# Patient Record
Sex: Female | Born: 1995
Health system: Southern US, Community
[De-identification: ages and names within clinical notes are randomized; demographics above are authoritative.]

## PROBLEM LIST (undated history)

## (undated) ENCOUNTER — Emergency Department (HOSPITAL_COMMUNITY): Discharge status: Absent without leave | Payer: Self-pay

## (undated) VITALS — BP 105/73 | HR 74 | Temp 97.9°F | Resp 16 | Ht 62.21 in | Wt 129.4 lb

## (undated) DIAGNOSIS — J302 Other seasonal allergic rhinitis: Secondary | ICD-10-CM

## (undated) DIAGNOSIS — F329 Major depressive disorder, single episode, unspecified: Secondary | ICD-10-CM

## (undated) DIAGNOSIS — F419 Anxiety disorder, unspecified: Secondary | ICD-10-CM

## (undated) DIAGNOSIS — F32A Depression, unspecified: Secondary | ICD-10-CM

## (undated) HISTORY — PX: TOOTH EXTRACTION: SHX859

## (undated) HISTORY — DX: Other seasonal allergic rhinitis: J30.2

---
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
## 2014-01-03 ENCOUNTER — Encounter (HOSPITAL_COMMUNITY): Payer: Self-pay | Admitting: Emergency Medicine

## 2014-01-03 DIAGNOSIS — Z7951 Long term (current) use of inhaled steroids: Secondary | ICD-10-CM | POA: Insufficient documentation

## 2014-01-03 DIAGNOSIS — Y9341 Activity, dancing: Secondary | ICD-10-CM | POA: Insufficient documentation

## 2014-01-03 DIAGNOSIS — Z3202 Encounter for pregnancy test, result negative: Secondary | ICD-10-CM | POA: Insufficient documentation

## 2014-01-03 DIAGNOSIS — J45909 Unspecified asthma, uncomplicated: Secondary | ICD-10-CM | POA: Insufficient documentation

## 2014-01-03 DIAGNOSIS — Z8659 Personal history of other mental and behavioral disorders: Secondary | ICD-10-CM | POA: Insufficient documentation

## 2014-01-03 DIAGNOSIS — Z880 Allergy status to penicillin: Secondary | ICD-10-CM | POA: Insufficient documentation

## 2014-01-03 DIAGNOSIS — Y9289 Other specified places as the place of occurrence of the external cause: Secondary | ICD-10-CM | POA: Insufficient documentation

## 2014-01-03 DIAGNOSIS — X58XXXA Exposure to other specified factors, initial encounter: Secondary | ICD-10-CM | POA: Insufficient documentation

## 2014-01-03 DIAGNOSIS — Z79899 Other long term (current) drug therapy: Secondary | ICD-10-CM | POA: Insufficient documentation

## 2014-01-03 DIAGNOSIS — S8392XA Sprain of unspecified site of left knee, initial encounter: Secondary | ICD-10-CM | POA: Insufficient documentation

## 2014-01-03 NOTE — ED Notes (Signed)
Pt reports she was at dance practice when she injured her left knee - states "she feels like her knee is dislocated." CMS distally to injury intact.

## 2014-01-04 ENCOUNTER — Emergency Department (HOSPITAL_COMMUNITY): Payer: Self-pay

## 2014-01-04 ENCOUNTER — Emergency Department (HOSPITAL_COMMUNITY)
Admission: EM | Admit: 2014-01-04 | Discharge: 2014-01-04 | Disposition: A | Discharge status: Home / Self Care | Payer: Self-pay | Attending: Emergency Medicine | Admitting: Emergency Medicine

## 2014-01-04 DIAGNOSIS — S8392XA Sprain of unspecified site of left knee, initial encounter: Secondary | ICD-10-CM

## 2014-01-04 LAB — POC URINE PREG, ED: Preg Test, Ur: NEGATIVE

## 2014-01-04 MED ORDER — HYDROCODONE-ACETAMINOPHEN 5-325 MG PO TABS
2.0000 | ORAL_TABLET | Freq: Once | ORAL | Status: AC
  Administered 2014-01-04: 2 via ORAL
  Filled 2014-01-04: qty 2

## 2014-01-04 MED ORDER — IBUPROFEN 800 MG PO TABS: 800.0000 mg | ORAL_TABLET | Freq: Three times a day (TID) | ORAL | Status: DC

## 2014-01-04 MED ORDER — HYDROCODONE-ACETAMINOPHEN 5-325 MG PO TABS: 2.0000 | ORAL_TABLET | ORAL | Status: DC | PRN

## 2014-01-04 NOTE — Discharge Instructions (Signed)

## 2014-01-04 NOTE — ED Provider Notes (Signed)
Medical screening examination/treatment/procedure(s) were performed by non-physician practitioner and as supervising physician I was immediately available for consultation/collaboration.   EKG Interpretation None        Tanna Furry, MD 01/04/14 2315

## 2014-01-04 NOTE — ED Provider Notes (Signed)
CSN: [id number]     Arrival date & time 01/03/14  2351 History   First MD Initiated Contact with Patient 01/04/14 0003     Chief Complaint  Patient presents with  . Knee Pain     (Consider location/radiation/quality/duration/timing/severity/associated sxs/prior Treatment) Patient is a 18 y.o. female presenting with knee pain. The history is provided by the patient. No language interpreter was used.  Knee Pain Location:  Knee Time since incident:  1 hour Injury: yes   Mechanism of injury comment:  Twist Knee location:  L knee Pain details:    Quality:  Aching   Radiates to:  Does not radiate   Severity:  Moderate   Onset quality:  Gradual   Duration:  1 hour   Timing:  Constant   Progression:  Worsening Chronicity:  New Dislocation: no   Prior injury to area:  Yes Relieved by:  Nothing Worsened by:  Nothing tried Ineffective treatments:  None tried Risk factors: no concern for non-accidental trauma   Pt was dancing and knee popped.  Pt thought she dislocated her knee  Past Medical History  Diagnosis Date  . Asthma   . Depression   . Anxiety    Past Surgical History  Procedure Laterality Date  . Tooth extraction     History reviewed. No pertinent family history. History  Substance Use Topics  . Smoking status: Never Smoker   . Smokeless tobacco: Never Used  . Alcohol Use: No   OB History   Grav Para Term Preterm Abortions TAB SAB Ect Mult Living                 Review of Systems  Musculoskeletal: Positive for joint swelling and myalgias.  All other systems reviewed and are negative.     Allergies  Penicillins  Home Medications   Prior to Admission medications   Medication Sig Start Date End Date Taking? Authorizing Provider  albuterol (PROVENTIL HFA;VENTOLIN HFA) 108 (90 BASE) MCG/ACT inhaler Inhale 2 puffs into the lungs every 6 (six) hours as needed. For shortness of breath 07/27/13   Janelle Floor, MD  cetirizine (ZYRTEC) 10 MG tablet  TAKE 1 TABLET BY MOUTH ONCE DAILY 07/01/13   Ok Edwards, MD  erythromycin ophthalmic ointment Place a 1/2 inch ribbon of ointment into the lower eyelid every 6 hours for 5 days 12/07/13   Gregor Hams, MD  fluticasone (FLONASE) 50 MCG/ACT nasal spray Place 2 sprays into the nose daily. 09/19/12   Shruti Anderson [place], MD  Fluticasone-Salmeterol (ADVAIR) 100-50 MCG/DOSE AEPB Inhale 1 puff into the lungs every 12 (twelve) hours. Patient may resume home supply. 09/19/12   Shruti Anderson [place], MD  ibuprofen (ADVIL,MOTRIN) 800 MG tablet Take 1 tablet (800 mg total) by mouth once. Take 3-4 hours prior to IUD insertion, repeat after procedure q 6 hrs prn 04/18/13   Andree Coss, MD  montelukast (SINGULAIR) 10 MG tablet TAKE 1 TABLET BY MOUTH ONCE DAILY    Ok Edwards, MD   LMP 01/01/2014 Physical Exam  Nursing note and vitals reviewed. Constitutional: She is oriented to person, place, and time. She appears well-developed and well-nourished.  HENT:  Head: Normocephalic.  Musculoskeletal: She exhibits tenderness.  Swollen tender left knee,  Pt unable to tolerate range of motion testing.  nv and ns intact,  No gross instability  Neurological: She is alert and oriented to person, place, and time. She has normal reflexes.  Skin: Skin is warm.  Psychiatric: She has  a normal mood and affect.    ED Course  Procedures (including critical care time) Labs Review Labs Reviewed  POC URINE PREG, ED    Imaging Review No results found.   EKG Interpretation None      MDM   Final diagnoses:  Knee sprain, left, initial encounter    Knee imbolixer crutches    Fransico Meadow, PA-C 01/04/14 [phone]

## 2014-01-04 NOTE — Progress Notes (Signed)
Orthopedic Tech Progress Note Patient Details:  Stacey Moyer 1995-07-20 [id number]  Ortho Devices Type of Ortho Device: Crutches;Knee Immobilizer Ortho Device/Splint Interventions: Application   Katheren Shams 01/04/2014, 12:57 AM

## 2014-01-09 ENCOUNTER — Ambulatory Visit: Payer: Medicaid Other

## 2014-01-12 ENCOUNTER — Encounter (INDEPENDENT_AMBULATORY_CARE_PROVIDER_SITE_OTHER): Payer: Self-pay

## 2014-01-12 ENCOUNTER — Ambulatory Visit (INDEPENDENT_AMBULATORY_CARE_PROVIDER_SITE_OTHER): Payer: Self-pay | Admitting: Family Medicine

## 2014-01-12 ENCOUNTER — Encounter: Payer: Self-pay | Admitting: Family Medicine

## 2014-01-12 VITALS — BP 114/64 | Ht 63.0 in | Wt 135.0 lb

## 2014-01-12 DIAGNOSIS — M25562 Pain in left knee: Secondary | ICD-10-CM

## 2014-01-12 DIAGNOSIS — S83002A Unspecified subluxation of left patella, initial encounter: Secondary | ICD-10-CM

## 2014-01-12 NOTE — Patient Instructions (Signed)
You had a patellar subluxation I want your to ICE twice a day for 15-20 minutes each time Do  Your exercises at least 4 times a day Heel slides--25 repetitions Quadricep contractions--25 reps Walk with crutches and 'touch down only" weight bearing Ok use wheel chair See me back in 2-3 weeks

## 2014-01-15 DIAGNOSIS — S83003A Unspecified subluxation of unspecified patella, initial encounter: Secondary | ICD-10-CM | POA: Insufficient documentation

## 2014-01-15 NOTE — Assessment & Plan Note (Signed)
I will keep her nonweightbearing which includes wheelchair at school and crutches the rest of the time. I will take her out of the knee immobilizer and begin range of motion exercises including heel slides and quadricep isometric contraction multiple times a day. Please see patient instructions. Icing. Long discussion about this knee injury which is most likely patellar subluxation rather than a simple knee sprain.it may take her to to 6 more weeks to get back to 90% function. I'll see her back in 2 weeks. I have given her notes for her school in her work.

## 2014-01-15 NOTE — Progress Notes (Signed)
Patient ID: Stacey Moyer, female   DOB: [birth year], 18 y.o.   MRN: [id number]  Stacey Moyer - 18 y.o. female MRN [id number]  Date of birth: 1995/05/27    SUBJECTIVE:     Left knee pain. Date of injury 01/04/2014. Patient was dancing in street shoes, that is not her regular daily shoes, when she lost her footing and slipped forward with acute knee pain. She heard a pop. Had fairly immediate swelling. Was seen at the emergency room where they placed her in the immobilizer and did x-rays. She is warned that knee immobilizer since then has continued to have swelling and stiffness. She is walking with crutches. ROS:     Positive for knee swelling but no knee erythema. Knee stiffness is present. No calf pain. No fever, sweats, chills.  PERTINENT  PMH / PSH FH / / SH:  Past Medical, Surgical, Social, and Family History Reviewed & Updated in the EMR.  Pertinent findings include:  Works part time and is going to school full-time. She has rendered a wheelchair to help her get around And says using crutches for everyday activities. Nonsmoker. Past medical history significant for asthma, no prior history of knee injury or knee surgeries.  OBJECTIVE: BP 114/64 mmHg  Ht 5\' 3"  (1.6 m)  Wt 135 lb (61.236 kg)  BMI 23.92 kg/m2  LMP 01/01/2014  Physical Exam:  Vital signs are reviewed. Well-developed female no acute distress KNEES: Left knee has small to moderate effusion. She has difficulty with full extension, she lacks full extension by 20. She has full flexion but pain with the last 15-20 of flexion. The knee Is in place. Tender to palpation along the medial and lateral border of the knee. Quad tendon and the patellar tendon are without defect. Calf is soft. Distally she is neurovascularly intact. IMAGING: Reviewed her for view knee x-rays from October 22 which showed no evidence of fracture.  ASSESSMENT & PLAN:  See problem based charting & AVS for pt instructions.

## 2014-01-31 ENCOUNTER — Encounter: Payer: Self-pay | Admitting: Pediatrics

## 2014-01-31 ENCOUNTER — Ambulatory Visit (INDEPENDENT_AMBULATORY_CARE_PROVIDER_SITE_OTHER): Payer: Self-pay | Admitting: Pediatrics

## 2014-01-31 VITALS — Wt 143.0 lb

## 2014-01-31 DIAGNOSIS — N938 Other specified abnormal uterine and vaginal bleeding: Secondary | ICD-10-CM

## 2014-01-31 LAB — CBC WITH DIFFERENTIAL/PLATELET
Basophils Absolute: 0 K/uL (ref 0.0–0.1)
Basophils Relative: 0 % (ref 0–1)
Eosinophils Absolute: 0 K/uL (ref 0.0–0.7)
Eosinophils Relative: 1 % (ref 0–5)
HCT: 38.5 % (ref 36.0–46.0)
Hemoglobin: 12.5 g/dL (ref 12.0–15.0)
Lymphocytes Relative: 50 % — ABNORMAL HIGH (ref 12–46)
Lymphs Abs: 2.3 K/uL (ref 0.7–4.0)
MCH: 28.5 pg (ref 26.0–34.0)
MCHC: 32.5 g/dL (ref 30.0–36.0)
MCV: 87.9 fL (ref 78.0–100.0)
MPV: 10.7 fL (ref 9.4–12.4)
Monocytes Absolute: 0.4 K/uL (ref 0.1–1.0)
Monocytes Relative: 8 % (ref 3–12)
Neutro Abs: 1.8 K/uL (ref 1.7–7.7)
Neutrophils Relative %: 41 % — ABNORMAL LOW (ref 43–77)
Platelets: 276 K/uL (ref 150–400)
RBC: 4.38 MIL/uL (ref 3.87–5.11)
RDW: 13.6 % (ref 11.5–15.5)
WBC: 4.5 K/uL (ref 4.0–10.5)

## 2014-01-31 MED ORDER — IBUPROFEN 600 MG PO TABS: 600.0000 mg | ORAL_TABLET | Freq: Three times a day (TID) | ORAL | Status: DC

## 2014-01-31 NOTE — Progress Notes (Signed)
History was provided by the patient.  Stacey Moyer is a 18 y.o. female who is here for irregular menstrual bleeding.    HPI:  Nexplanon Implant placed 07/18/13 - [place] reports that prior to implant, she had normal periods. (Placed strictly for contraception). This was her first form of contraception. Periods continued normally for several months, just a little more frequent than usual. Then, last month, had period 01/05/14 x 5 days, and again 01/19/14 x 3 days, brown blood then light blood then brown, then light blood. Still currently bleeding in this alternating manner (needs to wear a pantiliner all the time).  + abdominal cramping, prior to October, used to occur on first and last day of periods. Now, for the past 2 months, cramps are occuring through entire bleeding periods. Uses Advil 400mg  PRN for this. (She hasn't used much lately, because she took a lot of pain medicine recently for a knee sprain, so was trying to avoid taking too much, and hydrocodone pain medicine had made her sick to her stomach, so she was trying to avoid 'pain meds').  + sexually active, one partner x 2 years. Not using condoms. Last STD screen May 2015. Consents to GC/CT, HIV and RPR testing today. Denies any abnormal vaginal discharge other than blood. No dyspareunia.  No dysuria  Also c/o frequent headaches for several days to weeks. Doesn't drink much fluid in general.  Patient Active Problem List   Diagnosis Date Noted  . Patellar subluxation 01/15/2014  . Presence of subdermal contraceptive implant 02/15/2013  . Allergic rhinitis 09/19/2012  . Mild persistent asthma 09/19/2012   Current Outpatient Prescriptions on File Prior to Visit  Medication Sig Dispense Refill  . albuterol (PROVENTIL HFA;VENTOLIN HFA) 108 (90 BASE) MCG/ACT inhaler Inhale 2 puffs into the lungs every 6 (six) hours as needed. For shortness of breath 1 Inhaler 1  . cetirizine (ZYRTEC) 10 MG tablet TAKE 1 TABLET BY MOUTH ONCE DAILY 31  tablet PRN  . erythromycin ophthalmic ointment Place a 1/2 inch ribbon of ointment into the lower eyelid every 6 hours for 5 days 1 g 1  . fluticasone (FLONASE) 50 MCG/ACT nasal spray Place 2 sprays into the nose daily. 16 g 12  . Fluticasone-Salmeterol (ADVAIR) 100-50 MCG/DOSE AEPB Inhale 1 puff into the lungs every 12 (twelve) hours. Patient may resume home supply. 60 each 3  . HYDROcodone-acetaminophen (NORCO/VICODIN) 5-325 MG per tablet Take 2 tablets by mouth every 4 (four) hours as needed. 16 tablet 0  . ibuprofen (ADVIL,MOTRIN) 800 MG tablet Take 1 tablet (800 mg total) by mouth once. Take 3-4 hours prior to IUD insertion, repeat after procedure q 6 hrs prn 10 tablet 0  . ibuprofen (ADVIL,MOTRIN) 800 MG tablet Take 1 tablet (800 mg total) by mouth 3 (three) times daily. 21 tablet 0  . montelukast (SINGULAIR) 10 MG tablet TAKE 1 TABLET BY MOUTH ONCE DAILY 31 tablet PRN   No current facility-administered medications on file prior to visit.   The following portions of the patient's history were reviewed and updated as appropriate: allergies, current medications, past family history, past medical history, past social history, past surgical history and problem list.  Physical Exam:    Filed Vitals:   01/31/14 1406  Weight: 143 lb (64.864 kg)   Patient's last menstrual period was 01/01/2014. per CMA - this was later changed to above, per HPI.  General:   alert, cooperative and no distress  Gait:   normal  Skin:   normal  Oral cavity:   lips, mucosa, and tongue normal; teeth and gums normal  Eyes:   sclerae white, pupils equal and reactive     Neck:   no adenopathy, supple, symmetrical, trachea midline and thyroid not enlarged, symmetric, no tenderness/mass/nodules  Lungs:  clear to auscultation bilaterally  Heart:   regular rate and rhythm, S1, S2 normal, no murmur, click, rub or gallop  Abdomen:  soft, normal bowel sounds; mild vague tenderness to palpation throughout all quadrants   GU:  not examined  Extremities:   extremities normal, atraumatic, no cyanosis or edema  Neuro:  normal without focal findings and mental status, speech normal, alert and oriented x3    Assessment/Plan:  1. Dysfunctional uterine bleeding - patient is aware that this may be unwanted side effect of nexplanon - GC/chlamydia probe amp, urine - HIV antibody - RPR - CBC with Differential - ibuprofen (ADVIL,MOTRIN) 600 MG tablet; Take 1 tablet (600 mg total) by mouth 3 (three) times daily. For 7 days.  Dispense: 21 tablet; Refill: 1 - Flu vaccine nasal quad. - counseled regarding vaccine - consider oral hormonal therapy if no improvement - counseled re: likely cause, workup, other considerations, such as anemia, drink more fluids, document headaches, etc. Time spent: 25 minutes, with >50% counseling. - Follow-up visit in 2 weeks for recheck (of note, patient already has a follow up appointment with Dr. Henrene Pastor scheduled in 2 weeks), or sooner as needed.

## 2014-01-31 NOTE — Patient Instructions (Addendum)
Dysfunctional Uterine Bleeding Normally, menstrual periods begin between ages 91 to 9 in young women. A normal menstrual cycle/period may begin every 23 days up to 35 days and lasts from 1 to 7 days. Around 12 to 14 days before your menstrual period starts, ovulation (ovary produces an egg) occurs. When counting the time between menstrual periods, count from the first day of bleeding of the previous period to the first day of bleeding of the next period. Dysfunctional (abnormal) uterine bleeding is bleeding that is different from a normal menstrual period. Your periods may come earlier or later than usual. They may be lighter, have blood clots or be heavier. You may have bleeding between periods, or you may skip one period or more. You may have bleeding after sexual intercourse, bleeding after menopause, or no menstrual period. CAUSES   Pregnancy (normal, miscarriage, tubal).  IUDs (intrauterine device, birth control).  Birth control pills.  Hormone treatment.  Menopause.  Infection of the cervix.  Blood clotting problems.  Infection of the inside lining of the uterus.  Endometriosis, inside lining of the uterus growing in the pelvis and other female organs.  Adhesions (scar tissue) inside the uterus.  Obesity or severe weight loss.  Uterine polyps inside the uterus.  Cancer of the vagina, cervix, or uterus.  Ovarian cysts or polycystic ovary syndrome.  Medical problems (diabetes, thyroid disease).  Uterine fibroids (noncancerous tumor).  Problems with your female hormones.  Endometrial hyperplasia, very thick lining and enlarged cells inside of the uterus.  Medicines that interfere with ovulation.  Radiation to the pelvis or abdomen.  Chemotherapy. DIAGNOSIS   Your doctor will discuss the history of your menstrual periods, medicines you are taking, changes in your weight, stress in your life, and any medical problems you may have.  Your doctor will do a physical  and pelvic examination.  Your doctor may want to perform certain tests to make a diagnosis, such as:  Pap test.  Blood tests.  Cultures for infection. TREATMENT  Treatment will depend on the cause of the dysfunctional uterine bleeding (DUB). Treatment may include:  Observing your menstrual periods for a couple of months.  Prescribing medicines for medical problems, including:  Antibiotics.  Hormones.  Birth control pills.  NSAIDS (ibuprofen, diclofenac) HOME CARE INSTRUCTIONS   If medicines were prescribed, take exactly as directed. Do not change or switch medicines without consulting your caregiver.  Long term heavy bleeding may result in iron deficiency. Your caregiver may have prescribed iron pills. They help replace the iron that your body lost from heavy bleeding. Take exactly as directed.  Do not take aspirin or medicines that contain aspirin one week before or during your menstrual period. Aspirin may make the bleeding worse.  If you need to change your sanitary pad or tampon more than once every 2 hours, stay in bed with your feet elevated and a cold pack on your lower abdomen. Rest as much as possible, until the bleeding stops or slows down.  Eat well-balanced meals. Eat foods high in iron. Examples are:  Leafy green vegetables.  Whole-grain breads and cereals.  Eggs.  Meat.  Liver.  Do not try to lose weight until the abnormal bleeding has stopped and your blood iron level is back to normal. Do not lift more than ten pounds or do strenuous activities when you are bleeding.  For a couple of months, make note on your calendar, marking the start and ending of your period, and the type of bleeding (light,  medium, heavy, spotting, clots or missed periods). This is for your caregiver to better evaluate your problem. SEEK MEDICAL CARE IF:   You develop nausea (feeling sick to your stomach) and vomiting, dizziness, or diarrhea while you are taking your  medicine.  You are getting lightheaded or weak.  You have any problems that may be related to the medicine you are taking.  You develop pain with your DUB.  You want to remove your IUD.  You want to stop or change your birth control pills or hormones.  You have any type of abnormal bleeding mentioned above.  You are over 76 years old and have not had a menstrual period yet.  You are 18 years old and you are still having menstrual periods.  You have any of the symptoms mentioned above.  You develop a rash. SEEK IMMEDIATE MEDICAL CARE IF:   An oral temperature above 102 F (38.9 C) develops.  You develop chills.  You are changing your sanitary pad or tampon more than once an hour.  You develop abdominal pain.  You pass out or faint. Document Released: 02/28/2000 Document Revised: 05/25/2011 Document Reviewed: 01/29/2009 [facility] Patient Information 2015 [place], [place]. This information is not intended to replace advice given to you by your health care provider. Make sure you discuss any questions you have with your health care provider.

## 2014-02-01 ENCOUNTER — Telehealth: Payer: Self-pay | Admitting: *Deleted

## 2014-02-01 ENCOUNTER — Ambulatory Visit: Payer: Self-pay | Attending: Family Medicine | Admitting: Physical Therapy

## 2014-02-01 DIAGNOSIS — S83003D Unspecified subluxation of unspecified patella, subsequent encounter: Secondary | ICD-10-CM | POA: Insufficient documentation

## 2014-02-01 DIAGNOSIS — J45909 Unspecified asthma, uncomplicated: Secondary | ICD-10-CM | POA: Insufficient documentation

## 2014-02-01 DIAGNOSIS — S8392XA Sprain of unspecified site of left knee, initial encounter: Secondary | ICD-10-CM

## 2014-02-01 DIAGNOSIS — F419 Anxiety disorder, unspecified: Secondary | ICD-10-CM | POA: Insufficient documentation

## 2014-02-01 DIAGNOSIS — Z5189 Encounter for other specified aftercare: Secondary | ICD-10-CM | POA: Insufficient documentation

## 2014-02-01 DIAGNOSIS — F329 Major depressive disorder, single episode, unspecified: Secondary | ICD-10-CM | POA: Insufficient documentation

## 2014-02-01 DIAGNOSIS — S838X2A Sprain of other specified parts of left knee, initial encounter: Secondary | ICD-10-CM | POA: Insufficient documentation

## 2014-02-01 LAB — GC/CHLAMYDIA PROBE AMP, URINE
Chlamydia, Swab/Urine, PCR: NEGATIVE
GC Probe Amp, Urine: NEGATIVE

## 2014-02-01 LAB — HIV ANTIBODY (ROUTINE TESTING W REFLEX): HIV 1&2 Ab, 4th Generation: NONREACTIVE

## 2014-02-01 LAB — RPR

## 2014-02-01 NOTE — Telephone Encounter (Signed)
Pt stated that she was to come x2/ week, gave the pt appts but there was no appt notes...td

## 2014-02-01 NOTE — Patient Instructions (Signed)
The patient is advised to apply ice or cold packs intermittently as needed to relieve pain.  Copyright  VHI. All rights reserved.  Heel Slide   Bend knee and pull heel toward buttocks. Hold _5  seconds. Return. Repeat with other knee. Repeat ___10_ times. Do 2-3____ sessions per day.  http://gt2.exer.us/372   Copyright  VHI. All rights reserved.     Raise leg until knee is straight. ___10 reps per set, _2__ sets per day, __7_ days per week  Copyright  VHI. All rights reserved.  Short Arc [employer] a large can or rolled towel under leg. Straighten knee and leg. Hold ____5 seconds. Repeat with other leg. Repeat __10_ times. Do __2-3__ sessions per day.  http://gt2.exer.us/365   Copyright  VHI. All rights reserved.  Quad Set   Slowly tighten muscles on thigh of straight leg while counting out loud to __5__. Repeat with other leg. Repeat _5___ times. Do __2-3__ sessions per day.  http://gt2.exer.us/361   Copyright  VHI. All rights reserved.

## 2014-02-02 ENCOUNTER — Ambulatory Visit (INDEPENDENT_AMBULATORY_CARE_PROVIDER_SITE_OTHER): Payer: Self-pay | Admitting: Family Medicine

## 2014-02-02 ENCOUNTER — Encounter: Payer: Self-pay | Admitting: Physical Therapy

## 2014-02-02 VITALS — BP 118/76 | HR 71 | Ht 63.0 in | Wt 140.0 lb

## 2014-02-02 DIAGNOSIS — S83002D Unspecified subluxation of left patella, subsequent encounter: Secondary | ICD-10-CM

## 2014-02-02 NOTE — Therapy (Signed)
Physical Therapy Evaluation  Patient Details  Name: Stacey Moyer MRN: [id number] Date of Birth: [birth year]  Encounter Date: 02/01/2014      PT End of Session - 02/01/14 1544    Visit Number 1   Number of Visits 16   Date for PT Re-Evaluation 03/29/14   PT Start Time 1505   PT Stop Time 1550   PT Time Calculation (min) 45 min   Activity Tolerance Patient tolerated treatment well;Patient limited by pain      Past Medical History  Diagnosis Date  . Asthma   . Depression   . Anxiety     Past Surgical History  Procedure Laterality Date  . Tooth extraction      LMP 01/01/2014  Visit Diagnosis:  Knee sprain and strain, left, initial encounter      Subjective Assessment - 02/01/14 1511    Symptoms Patient slipped and fell during dance practice, saw Lt. kneecap pop out to the side, fell on knees.  She went to ED, could not bear weight and is now TTWB.    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 cumulative stiffness throughout the day   How long can you stand comfortably? 10 min Rt. leg tired   How long can you walk comfortably? with crutches, as needed.    Diagnostic tests XR   Patient Stated Goals cannot stand to cook, dance, return to work   Currently in Pain? Other (Comment)   Pain Score --  Pain increases in AM med/lat. , 5/10   Multiple Pain Sites No  gets cramping, soreness in thigh          [facility] PT Assessment - 02/01/14 1519    Assessment   Medical Diagnosis Lt. knee sprain   Onset Date 01/04/14   Next MD Visit 02/02/14   Prior Therapy none   Restrictions   Weight Bearing Restrictions Yes   LLE Weight Bearing --  TDWB   Balance Screen   Has the patient fallen in the past 6 months Yes   How many times? 1 for incident   Has the patient had a decrease in activity level because of a fear of falling?  No    Is the patient reluctant to leave their home because of a fear of falling?  No   Home Environment   Living Enviornment Private residence   Living Arrangements Spouse/significant other   Home Access Stairs to enter   Entrance Stairs-Number of Steps 1   Posture/Postural Control   Posture/Postural Control --  unable to stand equal weight on both legs with 1 crutch >5se   AROM   Right Knee Extension 0   Right Knee Flexion 130   Left Knee Extension --  -35 deg seated LAQ   Left Knee Flexion 125   Strength   Left Knee Flexion 5/5   Left Knee Extension 2+/5            PT Education - 02/01/14 1544    Education provided Yes   Education Details PT/POC, HEP basic knee level 1, ice and making an ice pack   Person(s) Educated Patient   Methods Explanation;Demonstration;Handout   Comprehension Verbalized understanding;Returned demonstration;Verbal cues required     Knee there ex for HEP: Level 1 knee AAROM: Heel slides, quad sets, SAQ and LAQ with assist x5-10 reps each.  Does not tolerate well, pain  increases.  Instructed to do 2-3 times per day.      PT Short Term Goals - 02/02/14 0813    PT SHORT TERM GOAL #1   Title Pt. will be I with initial HEP for Lt. knee   Baseline 4   Period Weeks   Status New   PT SHORT TERM GOAL #2   Title Walk without crutches and min pain increase   Baseline 4   Period Weeks   Status New   PT SHORT TERM GOAL #3   Title Pt. will achieve full PROM    Time 4   Status New   PT SHORT TERM GOAL #4   Title Pt. will demo 4/5 quad strength to ease gait, mobility on campus   Time 4   Period Weeks   Status New          PT Long Term Goals - 02/02/14 9563    PT LONG TERM GOAL #1   Title Pt. wil be I with advanced HEP for LE. LE   Time 8   Period Weeks   Status New   PT LONG TERM GOAL #2   Title Pt. will use and understanding RICE to prevent further injury   Time 8   Period Weeks   Status New   PT LONG TERM GOAL #3   Title Walk without  limp or device as needed in the community and min occ pain   Time 8   Period Weeks   Status New   PT LONG TERM GOAL #4   Title Return to light activitiy, dance with min occasional pain as guided by MD   Time 8   Period Weeks   Status New   PT LONG TERM GOAL #5   Title Quad strength 5/5   Additional Long Term Goals   Additional Long Term Goals Yes   PT LONG TERM GOAL #6   Title Stairs          Plan - 02/02/14 0800    Clinical Impression Statement Patient presents with residual pain, tenderness and swelling following her fall in Oct.  She has essentially been NWB since the fall and has not done AROM, HEP.  She will benefit from skilled PT to improve her ability to walk, return to work and recreation.     Pt will benefit from skilled therapeutic intervention in order to improve on the following deficits Abnormal gait;Decreased range of motion;Difficulty walking;Impaired flexibility;Decreased activity tolerance;Decreased knowledge of use of DME;Decreased strength;Decreased mobility   Rehab Potential Excellent   PT Frequency 2x / week   PT Duration 8 weeks   PT Treatment/Interventions ADLs/Self Care Home Management;Moist Heat;Patient/family education;Passive range of motion;Therapeutic exercise;DME Instruction;Ultrasound;Gait training;Manual techniques;Neuromuscular re-education;Stair training;Cryotherapy;Electrical Stimulation;Functional mobility training   PT Next Visit Plan review level 1 knee ex and PROM, modalities for pain   PT Home Exercise Plan level 1 knee (quad set, SAQ and AAROM knee )   Consulted and Agree with Plan of Care Patient        Problem List Patient Active Problem List   Diagnosis Date Noted  . Patellar subluxation 01/15/2014  . Presence of subdermal contraceptive implant 02/15/2013  . Allergic rhinitis 09/19/2012  . Mild persistent asthma 09/19/2012       Raeford Razor, PT 02/02/2014 10:34 AM Phone: [phone] Fax: [phone]    02/02/2014,  10:32 AM

## 2014-02-05 NOTE — Assessment & Plan Note (Signed)
Stacey Moyer disappointed that she did not get in sooner with physical therapy but she has been evaluated by them and has appointment set up for twice weekly. I am encouraged that her swelling and range of motion is improved. I'll see her back in 4-6 weeks.

## 2014-02-05 NOTE — Progress Notes (Signed)
   Subjective:    Patient ID: Stacey Moyer, female    DOB: 06/25/1995, 18 y.o.   MRN: [id number]  HPI  Date of injury 01/04/2014. Follow-up likely left knee patellar subluxation. She has been essentially nonweightbearing with only occasional touchdown weightbearing. She saw the physical therapist for the first time yesterday. She has been doing the range of motion exercises and heel slides I discussed with her. The swelling is much improved. She is anxious to get back to activities.  Review of Systems No new swelling. No Erythema. No fever, sweats, chills.    Objective:   Physical Exam Vital signs are reviewed GENERAL: Well-developed female no acute distress walking on crutches. KNEE: Left. No effusion remains. Mild tenderness still at the medial joint capsule but not at the joint line. She has full extension. Full flexion with some stiffness in the last 10-15. Distally she is neurovascular intact. Calf is soft.       Assessment & Plan:

## 2014-02-12 ENCOUNTER — Encounter: Payer: Self-pay | Admitting: Pediatrics

## 2014-02-12 NOTE — Progress Notes (Signed)
Pre-Visit Planning  Previous Psych Screenings:  None Psych Screenings Due: None  Review of previous notes:  Last seen in [facility] on 08/22/13.  Treatment plan at last visit included continue nexplanon which was originally inserted 07/18/13.  Saw Dr. Sharen Counter 11/11/13 for sinusitis.  Saw Dr. Herbert Moors 12/07/13 for eye irritation and was referred to ophthalmology.  Seen in ER 01/04/14 for knee injury. Follow-up with Dr. Nori Riis in Sports Med 01/12/14 and 02/02/14 for patellar subluxation.  Saw Dr. Tamala Julian 01/31/14 for DUB, likely due to nexplanon but other possible causes evaluated. Ibuprofen recommended to reduce bleeding.  Consider hormonal intervention in future.    Last CPE: None  Last STI screen & Pertinent Labs:  Component     Latest Ref Rng 01/31/2014  WBC     4.0 - 10.5 K/uL 4.5  RBC     3.87 - 5.11 MIL/uL 4.38  Hemoglobin     12.0 - 15.0 g/dL 12.5  HCT     36.0 - 46.0 % 38.5  MCV     78.0 - 100.0 fL 87.9  MCH     26.0 - 34.0 pg 28.5  MCHC     30.0 - 36.0 g/dL 32.5  RDW     11.5 - 15.5 % 13.6  Platelets     150 - 400 K/uL 276  MPV     9.4 - 12.4 fL 10.7  Neutrophils Relative %     43 - 77 % 41 (L)  NEUT#     1.7 - 7.7 K/uL 1.8  Lymphocytes Relative     12 - 46 % 50 (H)  Lymphocytes Absolute     0.7 - 4.0 K/uL 2.3  Monocytes Relative     3 - 12 % 8  Monocytes Absolute     0.1 - 1.0 K/uL 0.4  Eosinophils Relative     0 - 5 % 1  Eosinophils Absolute     0.0 - 0.7 K/uL 0.0  Basophils Relative     0 - 1 % 0  Basophils Absolute     0.0 - 0.1 K/uL 0.0  Smear Review      Criteria for review not met  Chlamydia, Swab/Urine, PCR     NEGATIVE NEGATIVE  GC Probe Amp, Urine     NEGATIVE NEGATIVE  HIV     NONREACTIVE NONREACTIVE  RPR     NON REAC NON REAC   Immunizations Due: UTD  To Do at visit:   - Assess bleeding - Consider OCP for DUB management

## 2014-02-13 ENCOUNTER — Ambulatory Visit: Payer: Self-pay | Attending: Family Medicine | Admitting: Rehabilitation

## 2014-02-13 ENCOUNTER — Ambulatory Visit (INDEPENDENT_AMBULATORY_CARE_PROVIDER_SITE_OTHER): Payer: Self-pay | Admitting: Pediatrics

## 2014-02-13 ENCOUNTER — Encounter: Payer: Self-pay | Admitting: Pediatrics

## 2014-02-13 ENCOUNTER — Encounter: Payer: Self-pay | Admitting: Licensed Clinical Social Worker

## 2014-02-13 VITALS — BP 106/62 | HR 76 | Wt 143.6 lb

## 2014-02-13 DIAGNOSIS — F329 Major depressive disorder, single episode, unspecified: Secondary | ICD-10-CM | POA: Insufficient documentation

## 2014-02-13 DIAGNOSIS — F4322 Adjustment disorder with anxiety: Secondary | ICD-10-CM

## 2014-02-13 DIAGNOSIS — J45909 Unspecified asthma, uncomplicated: Secondary | ICD-10-CM | POA: Insufficient documentation

## 2014-02-13 DIAGNOSIS — S838X2A Sprain of other specified parts of left knee, initial encounter: Secondary | ICD-10-CM | POA: Insufficient documentation

## 2014-02-13 DIAGNOSIS — Z975 Presence of (intrauterine) contraceptive device: Secondary | ICD-10-CM

## 2014-02-13 DIAGNOSIS — N946 Dysmenorrhea, unspecified: Secondary | ICD-10-CM

## 2014-02-13 DIAGNOSIS — F419 Anxiety disorder, unspecified: Secondary | ICD-10-CM | POA: Insufficient documentation

## 2014-02-13 DIAGNOSIS — S8392XA Sprain of unspecified site of left knee, initial encounter: Secondary | ICD-10-CM

## 2014-02-13 DIAGNOSIS — L818 Other specified disorders of pigmentation: Secondary | ICD-10-CM

## 2014-02-13 DIAGNOSIS — S83003D Unspecified subluxation of unspecified patella, subsequent encounter: Secondary | ICD-10-CM | POA: Insufficient documentation

## 2014-02-13 DIAGNOSIS — Z5189 Encounter for other specified aftercare: Secondary | ICD-10-CM | POA: Insufficient documentation

## 2014-02-13 MED ORDER — NAPROXEN SODIUM 275 MG PO TABS: 275.0000 mg | ORAL_TABLET | Freq: Two times a day (BID) | ORAL | Status: DC

## 2014-02-13 MED ORDER — MONTELUKAST SODIUM 10 MG PO TABS: 10.0000 mg | ORAL_TABLET | Freq: Every day | ORAL | Status: DC

## 2014-02-13 NOTE — Progress Notes (Signed)
[facility] met with patient briefly (less than 15 min) today. Patient is interested in being connected to [facility] Outpatient services. Per patient, she has had depression for many years and has received counseling and medication in the past. She is not currently suicidal and did not identify any significant changes or issues at this time, but wants to start counseling again. Patient requested [facility] as her brother goes there and patient has the [employer] assistance and no other insurance.   TC made to [facility] with patient in office. Voicemail left with patient and [facility]'s contact information. [employer] also provided patient with contact information for [facility] [place] as they can see patients on a sliding scale. Patient was not interested in scheduling a visit with behavioral health at [facility] at this time.

## 2014-02-13 NOTE — Patient Instructions (Addendum)
The light post on your skin look like Post-Inflammatory Hypopigmentation from Eczema.  Eczema Eczema, also called atopic dermatitis, is a skin disorder that causes inflammation of the skin. It causes a red rash and dry, scaly skin. The skin becomes very itchy. Eczema is generally worse during the cooler winter months and often improves with the warmth of summer. Eczema usually starts showing signs in infancy. Some children outgrow eczema, but it may last through adulthood.  CAUSES  The exact cause of eczema is not known, but it appears to run in families. People with eczema often have a family history of eczema, allergies, asthma, or hay fever. Eczema is not contagious. Flare-ups of the condition may be caused by:   Contact with something you are sensitive or allergic to.   Stress. SIGNS AND SYMPTOMS  Dry, scaly skin.   Red, itchy rash.   Itchiness. This may occur before the skin rash and may be very intense.  DIAGNOSIS  The diagnosis of eczema is usually made based on symptoms and medical history. TREATMENT  Eczema cannot be cured, but symptoms usually can be controlled with treatment and other strategies. A treatment plan might include:  Controlling the itching and scratching.   Use over-the-counter antihistamines as directed for itching. This is especially useful at night when the itching tends to be worse.   Use over-the-counter steroid creams as directed for itching.   Avoid scratching. Scratching makes the rash and itching worse. It may also result in a skin infection (impetigo) due to a break in the skin caused by scratching.   Keeping the skin well moisturized with creams every day. This will seal in moisture and help prevent dryness. Lotions that contain alcohol and water should be avoided because they can dry the skin.   Limiting exposure to things that you are sensitive or allergic to (allergens).   Recognizing situations that cause stress.   Developing a  plan to manage stress.  HOME CARE INSTRUCTIONS   Only take over-the-counter or prescription medicines as directed by your health care provider.   Do not use anything on the skin without checking with your health care provider.   Keep baths or showers short (5 minutes) in warm (not hot) water. Use mild cleansers for bathing. These should be unscented. You may add nonperfumed bath oil to the bath water. It is best to avoid soap and bubble bath.   Immediately after a bath or shower, when the skin is still damp, apply a moisturizing ointment to the entire body. This ointment should be a petroleum ointment. This will seal in moisture and help prevent dryness. The thicker the ointment, the better. These should be unscented.   Keep fingernails cut short. Children with eczema may need to wear soft gloves or mittens at night after applying an ointment.   Dress in clothes made of cotton or cotton blends. Dress lightly, because heat increases itching.   A child with eczema should stay away from anyone with fever blisters or cold sores. The virus that causes fever blisters (herpes simplex) can cause a serious skin infection in children with eczema. SEEK MEDICAL CARE IF:   Your itching interferes with sleep.   Your rash gets worse or is not better within 1 week after starting treatment.   You see pus or soft yellow scabs in the rash area.   You have a fever.   You have a rash flare-up after contact with someone who has fever blisters.  Document Released: 02/28/2000 Document  Revised: 12/21/2012 Document Reviewed: 10/03/2012 [facility] Patient Information 2015 [place], [place]. This information is not intended to replace advice given to you by your health care provider. Make sure you discuss any questions you have with your health care provider.

## 2014-02-13 NOTE — Therapy (Signed)
Physical Therapy Treatment  Patient Details  Name: Stacey Moyer MRN: [id number] Date of Birth: Mar 10, 1996  Encounter Date: 02/13/2014      PT End of Session - 02/13/14 1525    Visit Number 2   Number of Visits 16   Date for PT Re-Evaluation 03/29/14   PT Start Time 0303   PT Stop Time 0355   PT Time Calculation (min) 52 min      Past Medical History  Diagnosis Date  . Asthma   . Depression   . Anxiety     Past Surgical History  Procedure Laterality Date  . Tooth extraction      LMP 01/19/2014  Visit Diagnosis:  Knee sprain and strain, left, initial encounter      Subjective Assessment - 02/13/14 1522    Symptoms My doctor said I can WBAT, Have been performing HEP, not using crutches at home   Limitations Walking;Standing;House hold activities;Other (comment);Lifting   Currently in Pain? Yes   Pain Score 3    Pain Location Knee   Pain Orientation Left   Pain Descriptors / Indicators Cramping;Sore;Aching   Pain Type Acute pain   Multiple Pain Sites No          [facility] PT Assessment - 02/13/14 1527    AROM   Right Knee Extension -8   Left Knee Extension --  -16 degree quad lag with SLR   Left Knee Flexion 140   PROM   Left Knee Extension --  3          [facility] Adult PT Treatment/Exercise - 02/13/14 1538    Ambulation/Gait   Ambulation/Gait Yes   Ambulation/Gait Assistance 6: Modified independent (Device/Increase time)   Ambulation Distance (Feet) 200 Feet   Assistive device Crutches  1 crutch   Gait Pattern Step-through pattern   Knee/Hip Exercises: Stretches   Active Hamstring Stretch 30 seconds;3 reps   Knee/Hip Exercises: Aerobic   Stationary Bike --  Rec bike level 1 x 5 min   Knee/Hip Exercises: Supine   Quad Sets 2 sets;Strengthening;10 reps   Quad Sets Limitations --  pain and limited ROM   Short Arc Quad Sets 2 sets;AROM;10 reps   Straight Leg Raises 1 set;10 reps  1 set with ER   Modalities   Modalities Cryotherapy   Cryotherapy   Number Minutes Cryotherapy 15 Minutes   Cryotherapy Location Knee   Type of Cryotherapy Ice pack          PT Education - 02/13/14 1543    Education provided Yes   Education Details HEP, try 1 crutch   Person(s) Educated Patient   Methods Handout   Comprehension Verbalized understanding          PT Short Term Goals - 02/13/14 1552    PT SHORT TERM GOAL #1   Title Pt. will be I with initial HEP for Lt. knee   Baseline 4   Period Weeks   Status Achieved   PT SHORT TERM GOAL #2   Title Walk without crutches and min pain increase   Baseline 4   Period Weeks   Status On-going   PT SHORT TERM GOAL #3   Title Pt. will achieve full PROM    Time 4   Status On-going   PT SHORT TERM GOAL #4   Title Pt. will demo 4/5 quad strength to ease gait, mobility on campus   Time 4   Period Weeks   Status On-going  PT Long Term Goals - 02/13/14 1554    PT LONG TERM GOAL #1   Title Pt. wil be I with advanced HEP for LE. LE   Time 8   Period Weeks   Status On-going   PT LONG TERM GOAL #2   Title Pt. will use and understanding RICE to prevent further injury   Time 8   Period Weeks   Status On-going   PT LONG TERM GOAL #3   Title Walk without limp or device as needed in the community and min occ pain   Time 8   Period Weeks   Status On-going   PT LONG TERM GOAL #4   Title Return to light activitiy, dance with min occasional pain as guided by MD   Time 8   Period Weeks   Status On-going   PT LONG TERM GOAL #5   Title Quad strength 5/5   Time 8   Period Weeks   Status On-going          Plan - 02/13/14 1551    Clinical Impression Statement pt reports decreased pain and improved tolerance to HEP, lacks knee extension ROM and strength, able to ambulate with 1 crutch in clinic today with decreased antalgic gait.    PT Next Visit Plan review HEP, progress as tolerated, recheck knee ext AROM and PROM        Problem List Patient Active Problem List   Diagnosis  Date Noted  . Postinflammatory hypopigmentation 02/13/2014  . Dysmenorrhea 02/13/2014  . Patellar subluxation 01/15/2014  . Presence of subdermal contraceptive implant 02/15/2013  . Allergic rhinitis 09/19/2012  . Mild persistent asthma 09/19/2012                                              Dorene Ar , PTA  02/13/2014, 3:56 PM

## 2014-02-13 NOTE — Progress Notes (Signed)
11:38 AM  Adolescent Medicine Consultation Follow-Up Visit Stacey Moyer  is a 18 y.o. female referred by Dr. Derrell Lolling here today for follow-up of contraceptive management with nexplanon.   PCP Confirmed?  yes  Loleta Chance, MD   History was provided by the patient.  Previous Psych Screenings: None Psych Screenings Due: None  Review of previous notes:  Last seen in [facility] on 08/22/13. Treatment plan at last visit included continue nexplanon which was originally inserted 07/18/13. Saw Dr. Sharen Counter 11/11/13 for sinusitis. Saw Dr. Herbert Moors 12/07/13 for eye irritation and was referred to ophthalmology. Seen in ER 01/04/14 for knee injury. Follow-up with Dr. Nori Riis in Sports Med 01/12/14 and 02/02/14 for patellar subluxation. Saw Dr. Tamala Julian 01/31/14 for DUB, likely due to nexplanon but other possible causes evaluated. Ibuprofen recommended to reduce bleeding. Consider hormonal intervention in future.   Last CPE: None  Last STI screen & Pertinent Labs:  Component  Latest Ref Rng 01/31/2014  WBC  4.0 - 10.5 K/uL 4.5  RBC  3.87 - 5.11 MIL/uL 4.38  Hemoglobin  12.0 - 15.0 g/dL 12.5  HCT  36.0 - 46.0 % 38.5  MCV  78.0 - 100.0 fL 87.9  MCH  26.0 - 34.0 pg 28.5  MCHC  30.0 - 36.0 g/dL 32.5  RDW  11.5 - 15.5 % 13.6  Platelets  150 - 400 K/uL 276  MPV  9.4 - 12.4 fL 10.7  Neutrophils Relative %  43 - 77 % 41 (L)  NEUT#  1.7 - 7.7 K/uL 1.8  Lymphocytes Relative  12 - 46 % 50 (H)  Lymphocytes Absolute  0.7 - 4.0 K/uL 2.3  Monocytes Relative  3 - 12 % 8  Monocytes Absolute  0.1 - 1.0 K/uL 0.4  Eosinophils Relative  0 - 5 % 1  Eosinophils Absolute  0.0 - 0.7 K/uL 0.0  Basophils Relative  0 - 1 % 0  Basophils Absolute  0.0 - 0.1 K/uL 0.0  Smear Review   Criteria for review not met  Chlamydia, Swab/Urine, PCR  NEGATIVE NEGATIVE  GC Probe Amp,  Urine  NEGATIVE NEGATIVE  HIV  NONREACTIVE NONREACTIVE  RPR  NON REAC NON REAC   Immunizations Due: UTD  To Do at visit:  - Assess bleeding - Consider OCP for DUB management  Growth Chart Viewed? yes  HPI:  Pt reports bleeding has stopped, just some brown discharge, has cramps on and off.  She usually can ignore them or she will take advil which provides relief.  Pt reports not questions or concerns.  Still having her periods regularly but lighter.    Patient's last menstrual period was 01/19/2014.   Allergies  Allergen Reactions  . Penicillins Rash    unknown   Physical Exam:  Filed Vitals:   02/13/14 1121  BP: 106/62  Pulse: 76  Weight: 143 lb 9.6 oz (65.137 kg)   BP 106/62 mmHg  Pulse 76  Wt 143 lb 9.6 oz (65.137 kg)  LMP 01/19/2014 Body mass index: body mass index is 25.44 kg/(m^2). No height on file for this encounter.  Physical Exam  Constitutional: No distress.  Neck: No thyromegaly present.  Cardiovascular: Normal rate and regular rhythm.   Murmur heard. Abdominal: Soft. She exhibits no mass. There is no tenderness.  Musculoskeletal: She exhibits no edema.  Lymphadenopathy:    She has no cervical adenopathy.  Skin: Skin is warm.  Scattered hyperpigmented spots on arms, one patch right should that is c/w nummular eczema  Assessment/Plan: 1. Dysmenorrhea Advised trial of naproxen instead of ibuprofen.  Reassured that all STI screening was neg. - naproxen sodium (ANAPROX) 275 MG tablet; Take 1 tablet (275 mg total) by mouth 2 (two) times daily with a meal.  Dispense: 60 tablet; Refill: 2  2. Presence of subdermal contraceptive implant Continue with current method.  3. Postinflammatory hypopigmentation Likely related to eczema.  Gave information regarding eczema and advised to f/u with PCP if still concerned.   Pt also requested counseling.  Patient and/or legal guardian verbally consented to meet with [facility]  about presenting concerns.   Follow-up:  3 months  Medical decision-making:  > 30 minutes spent, more than 50% of appointment was spent discussing diagnosis and management of symptoms

## 2014-02-13 NOTE — Patient Instructions (Signed)
   Copyright  VHI. All rights reserved.  HIP: Flexion / KNEE: Extension, Straight Leg Raise   Raise leg, keeping knee straight. Perform slowly. ___ reps per set, ___ sets per day, ___ days per week   Short Arc [employer] a large can or rolled towel under leg. Straighten knee and leg. Hold ____ seconds. Repeat with other leg. Repeat ____ times. Do ____ sessions per day.  http://gt2.exer.us/365   Copyright  VHI. All rights reserved.   Leg Extension (Hamstring)   Sit toward front edge of chair, with leg out straight, heel on floor, toes pointing toward body. Keeping back straight, bend forward at hip, breathing out through pursed lips. Return, breathing in. Repeat _3__ times. . Do ___2 sessions per day. Variation: Perform from standing position, with support.  Hamstring Step 1   Straighten left knee. Keep knee level with other knee or on bolster. Hold _30__ seconds. Relax knee by returning foot to start. Repeat 3___ times. 2 times per day   Stretching: Hamstring (Sitting)   With right leg straight, tuck other foot near groin. Reach down until stretch is felt in back of thigh. Keep back straight. Hold _30___ seconds. Repeat _3___ times per set. Do _2___ sets per session. Do ____ sessions per day.  http://orth.exer.us/661   Copyright  VHI. All rights reserved.

## 2014-02-15 ENCOUNTER — Ambulatory Visit: Payer: Self-pay | Admitting: Physical Therapy

## 2014-02-15 DIAGNOSIS — S8392XA Sprain of unspecified site of left knee, initial encounter: Secondary | ICD-10-CM

## 2014-02-15 NOTE — Therapy (Signed)
Outpatient Rehabilitation [facility] [address] [place], [place], [address] Phone: [phone]   Fax:  [phone]  Physical Therapy Treatment  Patient Details  Name: Stacey Moyer MRN: [id number] Date of Birth: [birth year]  Encounter Date: 02/15/2014    Past Medical History  Diagnosis Date  . Asthma   . Depression   . Anxiety     Past Surgical History  Procedure Laterality Date  . Tooth extraction      LMP 01/19/2014  Visit Diagnosis:  Knee sprain and strain, left, initial encounter      Subjective Assessment - 02/15/14 1508    Symptoms Pain increases at night and first thing in am.  none now.    Limitations Walking;Standing;House hold activities;Other (comment);Lifting   How long can you sit comfortably? cumulative stiffness throughout the day   How long can you stand comfortably? 10 min Rt. leg tired          [facility] PT Assessment - 02/15/14 1511    Precautions   Precautions Knee  WBAT   AROM   Left Knee Extension --  17 deg quad lag for LAQ, AAROM +5 deg   Ambulation/Gait   Ambulation/Gait Yes   Ambulation/Gait Assistance 6: Modified independent (Device/Increase time)   Ambulation Distance (Feet) --  150   Gait Pattern Step-to pattern;Decreased stance time - left  dec knee ext, dec heel strike          [facility] Adult PT Treatment/Exercise - 02/15/14 1511    Knee/Hip Exercises: Stretches   Active Hamstring Stretch 3 reps;30 seconds   Knee/Hip Exercises: Seated   Long Arc Quad Strengthening;Left;2 sets   Long Arc Quad Weight 2 lbs.   Knee/Hip Exercises: Supine   Quad Sets AAROM;2 sets;20 reps   Short Arc Quad Sets Strengthening;Left;2 sets;10 reps   Straight Leg Raises Strengthening;Left;1 set;10 reps   Other Supine Knee Exercises --  hamstring stretch with sheet   Manual Therapy   Manual Therapy --  McConnell taping to correct lat tracking L.patell       also taped for edema to medial knee 1 fan          Plan - 02/15/14 1547     Clinical Impression Statement This patient is benefitting from PT, progressing well with AAROM, gait and pain.    PT Home Exercise Plan no change, just asked patient to focus on gait with increase heel strike   Consulted and Agree with Plan of Care Patient      .jen    Problem List Patient Active Problem List   Diagnosis Date Noted  . Postinflammatory hypopigmentation 02/13/2014  . Dysmenorrhea 02/13/2014  . Patellar subluxation 01/15/2014  . Presence of subdermal contraceptive implant 02/15/2013  . Allergic rhinitis 09/19/2012  . Mild persistent asthma 09/19/2012    PAA,JENNIFER 02/15/2014, 3:59 PM  Raeford Razor, PT 02/15/2014 3:59 PM Phone: [phone] Fax: [phone]

## 2014-02-20 ENCOUNTER — Ambulatory Visit: Payer: Self-pay | Admitting: Pediatrics

## 2014-02-20 ENCOUNTER — Ambulatory Visit: Payer: Self-pay | Admitting: Rehabilitation

## 2014-02-20 DIAGNOSIS — S8392XA Sprain of unspecified site of left knee, initial encounter: Secondary | ICD-10-CM

## 2014-02-20 NOTE — Therapy (Signed)
Outpatient Rehabilitation [facility] [address] [place], [place], [address] Phone: [phone]   Fax:  [phone]  Physical Therapy Treatment  Patient Details  Name: Stacey Moyer MRN: [id number] Date of Birth: [birth year]  Encounter Date: 02/20/2014      PT End of Session - 02/20/14 1503    Visit Number 3   Number of Visits 16   Date for PT Re-Evaluation 03/29/14   PT Start Time 0300   PT Stop Time 0400   PT Time Calculation (min) 60 min      Past Medical History  Diagnosis Date  . Asthma   . Depression   . Anxiety     Past Surgical History  Procedure Laterality Date  . Tooth extraction      LMP 01/19/2014  Visit Diagnosis:  Knee sprain and strain, left, initial encounter      Subjective Assessment - 02/20/14 1501    Symptoms Pt reports increased pain in calf and cramping occuring in calf the night after P.T. Left knee does not hurt now.   Currently in Pain? Yes   Pain Score 3    Pain Location Calf   Pain Orientation Left   Pain Descriptors / Indicators Sore;Cramping   Pain Type Acute pain          [facility] PT Assessment - 02/20/14 1506    AROM   Left Knee Extension -12  quad lag          [facility] Adult PT Treatment/Exercise - 02/20/14 0001    Knee/Hip Exercises: Stretches   Active Hamstring Stretch --   Gastroc Stretch 3 reps;30 seconds  also in seated with towel   Knee/Hip Exercises: Seated   Long Arc Quad Strengthening;Left;2 sets   Knee/Hip Exercises: Supine   Quad Sets AAROM;2 sets;20 reps   Short Arc Quad Sets Strengthening;Left;2 sets;10 reps   Straight Leg Raises Strengthening;Left;1 set;10 reps   Manual Therapy   Manual Therapy Massage;Other (comment)   calf STW   Other Manual Therapy --  McConnell taping to correct lat tracking [place],          PT Education - 02/20/14 1718    Education provided Yes   Education Details CALF STRETCHING   Person(s) Educated Patient   Methods Handout   Comprehension Verbalized  understanding              Plan - 02/20/14 1513    Clinical Impression Statement Pt reports less swelling after exercises when wearing KT tape and less pain with QS exercises wearing McConnels tape for lateral tracking, poor VMO, reduced calf pain after treatment today   PT Next Visit Plan continue knee strength and ROM as tolerated, try Nustep?           Problem List Patient Active Problem List   Diagnosis Date Noted  . Postinflammatory hypopigmentation 02/13/2014  . Dysmenorrhea 02/13/2014  . Patellar subluxation 01/15/2014  . Presence of subdermal contraceptive implant 02/15/2013  . Allergic rhinitis 09/19/2012  . Mild persistent asthma 09/19/2012    Dorene Ar, PTA 02/20/2014, 5:23 PM

## 2014-02-20 NOTE — Patient Instructions (Signed)
Gastroc / Heel Cord Stretch - Seated With Towel   Sit on floor, towel around ball of foot. Gently pull foot in toward body, stretching heel cord and calf. Hold for _30__ seconds. Repeat on involved leg. Repeat _3__ times. Do _2__ times per day.  Copyright  VHI. All rights reserved.  Achilles / Gastroc, Standing   Stand, right foot behind, heel on floor and turned slightly out, leg straight, forward leg bent. Move hips forward. Hold ___30 seconds. Repeat __3_ times per session. Do _2__ sessions per day.  Copyright  VHI. All rights reserved.

## 2014-02-22 ENCOUNTER — Ambulatory Visit: Payer: Self-pay | Admitting: Physical Therapy

## 2014-02-24 ENCOUNTER — Emergency Department (INDEPENDENT_AMBULATORY_CARE_PROVIDER_SITE_OTHER)
Admission: EM | Admit: 2014-02-24 | Discharge: 2014-02-24 | Disposition: A | Discharge status: Other Acute Inpatient Hospital | Payer: Self-pay | Source: Home / Self Care

## 2014-02-24 ENCOUNTER — Encounter (HOSPITAL_COMMUNITY): Payer: Self-pay

## 2014-02-24 ENCOUNTER — Encounter (HOSPITAL_COMMUNITY): Payer: Self-pay | Admitting: Emergency Medicine

## 2014-02-24 DIAGNOSIS — Z79899 Other long term (current) drug therapy: Secondary | ICD-10-CM | POA: Insufficient documentation

## 2014-02-24 DIAGNOSIS — R110 Nausea: Secondary | ICD-10-CM

## 2014-02-24 DIAGNOSIS — J45909 Unspecified asthma, uncomplicated: Secondary | ICD-10-CM | POA: Insufficient documentation

## 2014-02-24 DIAGNOSIS — R1031 Right lower quadrant pain: Secondary | ICD-10-CM

## 2014-02-24 DIAGNOSIS — N390 Urinary tract infection, site not specified: Secondary | ICD-10-CM | POA: Insufficient documentation

## 2014-02-24 DIAGNOSIS — Z880 Allergy status to penicillin: Secondary | ICD-10-CM | POA: Insufficient documentation

## 2014-02-24 DIAGNOSIS — Z8659 Personal history of other mental and behavioral disorders: Secondary | ICD-10-CM | POA: Insufficient documentation

## 2014-02-24 DIAGNOSIS — R1033 Periumbilical pain: Secondary | ICD-10-CM

## 2014-02-24 LAB — CBC WITH DIFFERENTIAL/PLATELET
Basophils Absolute: 0 10*3/uL (ref 0.0–0.1)
Basophils Relative: 0 % (ref 0–1)
EOS ABS: 0.1 10*3/uL (ref 0.0–0.7)
EOS PCT: 1 % (ref 0–5)
HCT: 36.4 % (ref 36.0–46.0)
HEMOGLOBIN: 12.5 g/dL (ref 12.0–15.0)
Lymphocytes Relative: 29 % (ref 12–46)
Lymphs Abs: 2.3 10*3/uL (ref 0.7–4.0)
MCH: 29.3 pg (ref 26.0–34.0)
MCHC: 34.3 g/dL (ref 30.0–36.0)
MCV: 85.2 fL (ref 78.0–100.0)
Monocytes Absolute: 0.5 10*3/uL (ref 0.1–1.0)
Monocytes Relative: 6 % (ref 3–12)
Neutro Abs: 4.9 10*3/uL (ref 1.7–7.7)
Neutrophils Relative %: 64 % (ref 43–77)
Platelets: 264 10*3/uL (ref 150–400)
RBC: 4.27 MIL/uL (ref 3.87–5.11)
RDW: 12.8 % (ref 11.5–15.5)
WBC: 7.7 10*3/uL (ref 4.0–10.5)

## 2014-02-24 LAB — COMPREHENSIVE METABOLIC PANEL
ALT: 9 U/L (ref 0–35)
ANION GAP: 14 (ref 5–15)
AST: 15 U/L (ref 0–37)
Albumin: 4.1 g/dL (ref 3.5–5.2)
Alkaline Phosphatase: 71 U/L (ref 39–117)
BUN: 11 mg/dL (ref 6–23)
CO2: 24 mEq/L (ref 19–32)
Calcium: 9.1 mg/dL (ref 8.4–10.5)
Chloride: 100 mEq/L (ref 96–112)
Creatinine, Ser: 0.53 mg/dL (ref 0.50–1.10)
GFR calc non Af Amer: >90 mL/min (ref >90)
GLUCOSE: 96 mg/dL (ref 70–99)
Potassium: 4.1 mEq/L (ref 3.7–5.3)
Sodium: 138 mEq/L (ref 137–147)
TOTAL PROTEIN: 7.4 g/dL (ref 6.0–8.3)
Total Bilirubin: <0.2 mg/dL — ABNORMAL LOW (ref 0.3–1.2)

## 2014-02-24 LAB — POCT PREGNANCY, URINE: Preg Test, Ur: NEGATIVE

## 2014-02-24 LAB — POCT URINALYSIS DIP (DEVICE)
Bilirubin Urine: NEGATIVE
Glucose, UA: NEGATIVE mg/dL
KETONES UR: NEGATIVE mg/dL
NITRITE: NEGATIVE
PH: 6 (ref 5.0–8.0)
Protein, ur: 100 mg/dL — AB
Specific Gravity, Urine: >=1.03 (ref 1.005–1.030)
Urobilinogen, UA: 0.2 mg/dL (ref 0.0–1.0)

## 2014-02-24 LAB — LIPASE, BLOOD: Lipase: 30 U/L (ref 11–59)

## 2014-02-24 MED ORDER — ONDANSETRON 4 MG PO TBDP
ORAL_TABLET | ORAL | Status: AC
  Filled 2014-02-24: qty 1

## 2014-02-24 MED ORDER — ONDANSETRON 4 MG PO TBDP
4.0000 mg | ORAL_TABLET | Freq: Once | ORAL | Status: AC
  Administered 2014-02-24: 4 mg via ORAL

## 2014-02-24 NOTE — ED Notes (Signed)
C/o 3 day duratio nof periumbilical and RLQ pain, some nausea (no vomiting) When ride over would hit bumps in road, this increased pain

## 2014-02-24 NOTE — ED Provider Notes (Signed)
CSN: [id number]     Arrival date & time 02/24/14  1833 History   None    Chief Complaint  Patient presents with  . Abdominal Pain   (Consider location/radiation/quality/duration/timing/severity/associated sxs/prior Treatment) HPI            18 year old female presents complaining of periumbilical abdominal pain, urinary frequency, nausea. The pain started about 3 days ago. It has gradually worsened. Pain is constant. She has not been able to anything today. She has nausea as well without vomiting. No fever. No history of stomach surgeries. No vaginal discharge or bleeding.  Past Medical History  Diagnosis Date  . Asthma   . Depression   . Anxiety    Past Surgical History  Procedure Laterality Date  . Tooth extraction     History reviewed. No pertinent family history. History  Substance Use Topics  . Smoking status: Never Smoker   . Smokeless tobacco: Never Used  . Alcohol Use: No   OB History    No data available     Review of Systems  Constitutional: Negative for fever and chills.  Gastrointestinal: Positive for nausea and abdominal pain. Negative for vomiting and blood in stool.  Genitourinary: Positive for frequency. Negative for dysuria, urgency, vaginal bleeding, vaginal discharge, vaginal pain and pelvic pain.  All other systems reviewed and are negative.   Allergies  Penicillins  Home Medications   Prior to Admission medications   Medication Sig Start Date End Date Taking? Authorizing Provider  albuterol (PROVENTIL HFA;VENTOLIN HFA) 108 (90 BASE) MCG/ACT inhaler Inhale 2 puffs into the lungs every 6 (six) hours as needed. For shortness of breath 07/27/13   Leigh-Anne Cioffredi, MD  cetirizine (ZYRTEC) 10 MG tablet TAKE 1 TABLET BY MOUTH ONCE DAILY Patient not taking: Reported on 02/13/2014 07/01/13   Ok Edwards, MD  erythromycin ophthalmic ointment Place a 1/2 inch ribbon of ointment into the lower eyelid every 6 hours for 5 days Patient not taking: Reported  on 02/13/2014 12/07/13   Gregor Hams, MD  fluticasone (FLONASE) 50 MCG/ACT nasal spray Place 2 sprays into the nose daily. Patient not taking: Reported on 02/13/2014 09/19/12   Shruti Anderson Malta, MD  Fluticasone-Salmeterol (ADVAIR) 100-50 MCG/DOSE AEPB Inhale 1 puff into the lungs every 12 (twelve) hours. Patient may resume home supply. Patient not taking: Reported on 02/13/2014 09/19/12   Shruti Anderson Malta, MD  montelukast (SINGULAIR) 10 MG tablet Take 1 tablet (10 mg total) by mouth daily. 02/13/14   Andree Coss, MD  naproxen sodium (ANAPROX) 275 MG tablet Take 1 tablet (275 mg total) by mouth 2 (two) times daily with a meal. 02/13/14   Andree Coss, MD   BP 121/63 mmHg  Pulse 71  Temp(Src) 98.4 F (36.9 C) (Oral)  Resp 16  SpO2 100%  LMP 02/19/2014 (Exact Date) Physical Exam  Constitutional: She is oriented to person, place, and time. Vital signs are normal. She appears well-developed and well-nourished. No distress.  HENT:  Head: Normocephalic and atraumatic.  Pulmonary/Chest: Effort normal. No respiratory distress.  Abdominal: Soft. Normal appearance. There is tenderness in the right lower quadrant and periumbilical area. There is guarding and tenderness at McBurney's point. There is no rigidity, no rebound and no CVA tenderness.  Positive obturator sign and positive psoas sign  Neurological: She is alert and oriented to person, place, and time. She has normal strength. Coordination normal.  Skin: Skin is warm and dry. No rash noted. She is not diaphoretic.  Psychiatric: She  has a normal mood and affect. Judgment normal.  Nursing note and vitals reviewed.   ED Course  Procedures (including critical care time) Labs Review Labs Reviewed  POCT URINALYSIS DIP (DEVICE) - Abnormal; Notable for the following:    Hgb urine dipstick MODERATE (*)    Protein, ur 100 (*)    Leukocytes, UA SMALL (*)    All other components within normal limits  POCT PREGNANCY, URINE    Imaging  Review No results found.   MDM   1. Abdominal pain, RLQ   2. Abdominal pain, periumbilical   3. Nausea without vomiting    Concern for appendicitis, not convinced with this patient's symptoms and exam that this can all be explained by urinary tract infection. She needs further evaluation today in the emergency department. She is being transferred via shuttle. She was given 4 mg of Zofran ODT prior to transfer. She declines any pain medications  Liam Graham, PA-C 02/24/14 [phone]

## 2014-02-24 NOTE — ED Notes (Signed)
Pt. transferred from [facility] this evening reports periumbilical and right abdominal pain with dysuria and nausea , denies fever or chills,

## 2014-02-25 ENCOUNTER — Emergency Department (HOSPITAL_COMMUNITY)
Admission: EM | Admit: 2014-02-25 | Discharge: 2014-02-25 | Disposition: A | Discharge status: Home / Self Care | Payer: Self-pay | Attending: Emergency Medicine | Admitting: Emergency Medicine

## 2014-02-25 ENCOUNTER — Emergency Department (HOSPITAL_COMMUNITY): Payer: Self-pay

## 2014-02-25 DIAGNOSIS — R1031 Right lower quadrant pain: Secondary | ICD-10-CM

## 2014-02-25 DIAGNOSIS — N390 Urinary tract infection, site not specified: Secondary | ICD-10-CM

## 2014-02-25 LAB — URINE MICROSCOPIC-ADD ON

## 2014-02-25 LAB — URINALYSIS, ROUTINE W REFLEX MICROSCOPIC
Bilirubin Urine: NEGATIVE
Glucose, UA: NEGATIVE mg/dL
Ketones, ur: NEGATIVE mg/dL
Nitrite: NEGATIVE
PROTEIN: 100 mg/dL — AB
Specific Gravity, Urine: 1.023 (ref 1.005–1.030)
UROBILINOGEN UA: 0.2 mg/dL (ref 0.0–1.0)
pH: 5.5 (ref 5.0–8.0)

## 2014-02-25 MED ORDER — MORPHINE SULFATE 4 MG/ML IJ SOLN
4.0000 mg | Freq: Once | INTRAMUSCULAR | Status: AC
  Administered 2014-02-25: 4 mg via INTRAVENOUS
  Filled 2014-02-25: qty 1

## 2014-02-25 MED ORDER — HYDROCODONE-ACETAMINOPHEN 5-325 MG PO TABS: 2.0000 | ORAL_TABLET | ORAL | Status: DC | PRN

## 2014-02-25 MED ORDER — IOHEXOL 300 MG/ML  SOLN
100.0000 mL | Freq: Once | INTRAMUSCULAR | Status: AC | PRN
  Administered 2014-02-25: 100 mL via INTRAVENOUS

## 2014-02-25 MED ORDER — CIPROFLOXACIN HCL 500 MG PO TABS: 500.0000 mg | ORAL_TABLET | Freq: Two times a day (BID) | ORAL | Status: DC

## 2014-02-25 MED ORDER — CIPROFLOXACIN IN D5W 400 MG/200ML IV SOLN
400.0000 mg | Freq: Once | INTRAVENOUS | Status: AC
  Administered 2014-02-25: 400 mg via INTRAVENOUS
  Filled 2014-02-25: qty 200

## 2014-02-25 MED ORDER — ONDANSETRON 8 MG PO TBDP: 8.0000 mg | ORAL_TABLET | Freq: Three times a day (TID) | ORAL | Status: DC | PRN

## 2014-02-25 MED ORDER — ONDANSETRON HCL 4 MG/2ML IJ SOLN
4.0000 mg | Freq: Once | INTRAMUSCULAR | Status: AC
  Administered 2014-02-25: 4 mg via INTRAVENOUS
  Filled 2014-02-25: qty 2

## 2014-02-25 NOTE — ED Provider Notes (Signed)
CSN: [id number]     Arrival date & time 02/24/14  1930 History  This chart was scribed for Kalman Drape, MD by [facility], ED Scribe. The patient was seen in A10C/A10C and the patient's care was started at 12:13 AM. Chief Complaint  Patient presents with  . Abdominal Pain   The history is provided by the patient. No language interpreter was used.    HPI Comments: Stacey Moyer is a 18 y.o. female who presents to the Emergency Department complaining of right sided abdominal pain and periumbilical pain.  Patient was sent from urgent care with concern for possible appendicitis.  She does have increased urinary frequency, nausea and dysuria as associated symptoms. Pt states she has a history of UTIs. Her last BM was this morning. Her menstrual period ended yesterday. No history of genitourinary problems. Pt denies fever.  No vaginal discharge, no new sexual partners. Past Medical History  Diagnosis Date  . Asthma   . Depression   . Anxiety    Past Surgical History  Procedure Laterality Date  . Tooth extraction     No family history on file. History  Substance Use Topics  . Smoking status: Never Smoker   . Smokeless tobacco: Never Used  . Alcohol Use: No   OB History    No data available     Review of Systems  Constitutional: Negative for fever.  Gastrointestinal: Positive for nausea and abdominal pain.  Genitourinary: Positive for dysuria and frequency.  All other systems reviewed and are negative.  Allergies  Penicillins  Home Medications   Prior to Admission medications   Medication Sig Start Date End Date Taking? Authorizing Provider  albuterol (PROVENTIL HFA;VENTOLIN HFA) 108 (90 BASE) MCG/ACT inhaler Inhale 2 puffs into the lungs every 6 (six) hours as needed. For shortness of breath 07/27/13   Leigh-Anne Cioffredi, MD  cetirizine (ZYRTEC) 10 MG tablet TAKE 1 TABLET BY MOUTH ONCE DAILY Patient not taking: Reported on 02/13/2014 07/01/13   Ok Edwards, MD   erythromycin ophthalmic ointment Place a 1/2 inch ribbon of ointment into the lower eyelid every 6 hours for 5 days Patient not taking: Reported on 02/13/2014 12/07/13   Gregor Hams, MD  fluticasone (FLONASE) 50 MCG/ACT nasal spray Place 2 sprays into the nose daily. Patient not taking: Reported on 02/13/2014 09/19/12   Shruti Anderson Malta, MD  Fluticasone-Salmeterol (ADVAIR) 100-50 MCG/DOSE AEPB Inhale 1 puff into the lungs every 12 (twelve) hours. Patient may resume home supply. Patient not taking: Reported on 02/13/2014 09/19/12   Shruti Anderson Malta, MD  montelukast (SINGULAIR) 10 MG tablet Take 1 tablet (10 mg total) by mouth daily. 02/13/14   Andree Coss, MD  naproxen sodium (ANAPROX) 275 MG tablet Take 1 tablet (275 mg total) by mouth 2 (two) times daily with a meal. 02/13/14   Andree Coss, MD   LMP 02/19/2014 (Exact Date) Physical Exam  Constitutional: She is oriented to person, place, and time. She appears well-developed and well-nourished.  HENT:  Head: Normocephalic and atraumatic.  Nose: Nose normal.  Mouth/Throat: Oropharynx is clear and moist.  Eyes: Conjunctivae and EOM are normal. Pupils are equal, round, and reactive to light.  Neck: Normal range of motion. Neck supple. No JVD present. No tracheal deviation present. No thyromegaly present.  Cardiovascular: Normal rate, regular rhythm, normal heart sounds and intact distal pulses.  Exam reveals no gallop and no friction rub.   No murmur heard. Pulmonary/Chest: Effort normal and breath sounds  normal. No stridor. No respiratory distress. She has no wheezes. She has no rales. She exhibits no tenderness.  Abdominal: Soft. Bowel sounds are normal. She exhibits no distension and no mass. There is tenderness (patient has tenderness and right lower quadrant and suprapubic with mild rebound). There is no rebound and no guarding.  Musculoskeletal: Normal range of motion. She exhibits no edema or tenderness.  Lymphadenopathy:    She  has no cervical adenopathy.  Neurological: She is alert and oriented to person, place, and time. She displays normal reflexes. She exhibits normal muscle tone. Coordination normal.  Skin: Skin is warm and dry. No rash noted. No erythema. No pallor.  Psychiatric: She has a normal mood and affect. Her behavior is normal. Judgment and thought content normal.  Nursing note and vitals reviewed.   ED Course  Procedures  DIAGNOSTIC STUDIES: Oxygen Saturation is100% on room air, normal by my interpretation.    COORDINATION OF CARE: 12:15 AM Discussed treatment plan with pt at bedside and pt agreed to plan.  Labs Review Labs Reviewed  COMPREHENSIVE METABOLIC PANEL - Abnormal; Notable for the following:    Total Bilirubin <0.2 (*)    All other components within normal limits  URINALYSIS, ROUTINE W REFLEX MICROSCOPIC - Abnormal; Notable for the following:    APPearance CLOUDY (*)    Hgb urine dipstick MODERATE (*)    Protein, ur 100 (*)    Leukocytes, UA MODERATE (*)    All other components within normal limits  URINE MICROSCOPIC-ADD ON - Abnormal; Notable for the following:    Bacteria, UA FEW (*)    All other components within normal limits  URINE CULTURE  CBC WITH DIFFERENTIAL  LIPASE, BLOOD   Imaging Review Ct Abdomen Pelvis W Contrast  02/25/2014   CLINICAL DATA:  Periumbilical and right lower quadrant pain several days. Nausea without vomiting.  EXAM: CT ABDOMEN AND PELVIS WITH CONTRAST  TECHNIQUE: Multidetector CT imaging of the abdomen and pelvis was performed using the standard protocol following bolus administration of intravenous contrast.  CONTRAST:  150mL OMNIPAQUE IOHEXOL 300 MG/ML  SOLN  COMPARISON:  05/09/2012  FINDINGS: Lung bases are unremarkable.  Abdominal images demonstrate a normal liver, spleen, pancreas, gallbladder and adrenal glands. Kidneys are within normal. Ureters are unremarkable. The appendix is within normal. Vascular structures are normal.  Pelvic images  demonstrate the uterus, ovaries and rectum to be within normal. No significant free fluid. There is mild circumferential bladder wall thickening which could be seen due to cystitis. Remaining bones soft tissues are within normal.  IMPRESSION: Mild bladder wall thickening which could be seen due to cystitis.   Electronically Signed   By: Marin Olp M.D.   On: 02/25/2014 02:25     EKG Interpretation None      MDM   Final diagnoses:  RLQ abdominal pain  UTI (lower urinary tract infection)  I personally performed the services described in this documentation, which was scribed in my presence. The recorded information has been reviewed and is accurate.  Patient with signs of urinary tract infection, but appears to have pain out of proportion to her infection on physical exam.  Plan for CT scan for further evaluation   CT scan without appendicitis, bladder wall thickening.  Possible cystitis.  Patient feeling better, tolerating by mouth no further vomiting.  Will discharge home with antibiotics.   Kalman Drape, MD 02/26/14 [phone]

## 2014-02-25 NOTE — Discharge Instructions (Signed)

## 2014-02-25 NOTE — ED Notes (Signed)
Pt. Left with all belongings 

## 2014-02-25 NOTE — ED Notes (Signed)
Pt. Is able to tolerate liquids

## 2014-02-27 ENCOUNTER — Ambulatory Visit: Payer: Self-pay | Admitting: Physical Therapy

## 2014-02-27 DIAGNOSIS — S8392XA Sprain of unspecified site of left knee, initial encounter: Secondary | ICD-10-CM

## 2014-02-27 LAB — URINE CULTURE: Colony Count: >=100000

## 2014-02-27 NOTE — Therapy (Signed)
[facility] [address] [place], [place], [address] Phone: [phone]   Fax:  [phone]  Physical Therapy Treatment  Patient Details  Name: Stacey Moyer MRN: [id number] Date of Birth: February 27, 1996  Encounter Date: 02/27/2014      PT End of Session - 02/27/14 1234    Visit Number 4   Number of Visits 16   Date for PT Re-Evaluation 03/29/14   PT Start Time 7510   PT Stop Time 1236   PT Time Calculation (min) 41 min   Activity Tolerance Patient tolerated treatment well      Past Medical History  Diagnosis Date  . Asthma   . Depression   . Anxiety     Past Surgical History  Procedure Laterality Date  . Tooth extraction      LMP 02/19/2014 (Exact Date)  Visit Diagnosis:  Knee sprain and strain, left, initial encounter      Subjective Assessment - 02/27/14 1157    Symptoms Haven't use crutches in awhile (last Wed). Sore medial L. knee.    How long can you stand comfortably? 30 min   How long can you walk comfortably? without crutches as needed, min pain increase    Patient Stated Goals cannot stand to cook, dance, return to work   Currently in Pain? Yes   Pain Score 2    Pain Location Knee   Pain Orientation Left;Medial   Pain Descriptors / Indicators Sore   Pain Type Chronic pain   Pain Onset More than a month ago   Pain Frequency Intermittent   Aggravating Factors  nighttime   Pain Relieving Factors stretching, ice    Effect of Pain on Daily Activities can't dance   Multiple Pain Sites No          [facility] PT Assessment - 02/27/14 1242    Strength   Right Knee Flexion 5/5   Right Knee Extension --  4+/5   Left Knee Flexion 5/5   Left Knee Extension 3+/5   Static Standing Balance   Static Standing - Balance Support No upper extremity supported   Static Standing - Comment/# of Minutes 20 sec          [facility] Adult PT Treatment/Exercise - 02/27/14 1206    Knee/Hip Exercises: Aerobic   Stationary Bike --  5 min  level 2    Knee/Hip Exercises: Machines for Strengthening   Cybex Leg Press --  1 plate x 20, 2 plates x 10 used ball for alignment.    Total Gym Leg Press --  on leg press, heel lift and stretch for post L. knee   Knee/Hip Exercises: Standing   Forward Step Up 2 sets  x 10, 4 inch   SLS --  20 sec   SLS with Vectors --  hip abd and ext   Knee/Hip Exercises: Seated   Long Arc Quad Strengthening;Left;2 sets   Long Arc Quad Weight --  4   Knee/Hip Exercises: Supine   Bridges Strengthening;Both;1 set   Straight Leg Raises Strengthening;2 sets   Straight Leg Raises Limitations --  quad lag 12 deg.  lacks 4 deg AROM in Lt. knee from full ext   Straight Leg Raise with External Rotation Strengthening;1 set   Cryotherapy   Number Minutes Cryotherapy --  7   Cryotherapy Location Knee   Type of Cryotherapy Ice pack          PT Education - 02/27/14 1233    Education provided Yes  Education Details progress, core strength   Person(s) Educated Patient   Methods Explanation   Comprehension Verbalized understanding;Returned demonstration          PT Short Term Goals - 02/27/14 1240    PT SHORT TERM GOAL #1   Title Pt. will be I with initial HEP for Lt. knee   Status Achieved   PT SHORT TERM GOAL #2   Title Walk without crutches and min pain increase   Status Achieved   PT SHORT TERM GOAL #3   Title Pt. will achieve full PROM    Status Achieved   PT SHORT TERM GOAL #4   Title Pt. will demo 4/5 quad strength to ease gait, mobility on campus   Status On-going          PT Long Term Goals - 02/27/14 1241    PT LONG TERM GOAL #1   Status On-going   PT LONG TERM GOAL #2   Title Pt. will use and understanding RICE to prevent further injury   Status Achieved   PT LONG TERM GOAL #3   Title Walk without limp or device as needed in the community and min occ pain   Status On-going   PT LONG TERM GOAL #4   Title Return to light activitiy, dance with min occasional pain as  guided by MD   Status On-going   PT LONG TERM GOAL #5   Title Quad strength 5/5   Status On-going          Plan - 02/27/14 1239    Clinical Impression Statement Patient is making progress towards all goals and tolerating more activity.  See goals met.  Patient is curious about when she can dance, return to work.    PT Next Visit Plan SLR with hip ER, standing balance/stability ex   PT Home Exercise Plan continue as previous but add hip ER for VMO      Problem List Patient Active Problem List   Diagnosis Date Noted  . Postinflammatory hypopigmentation 02/13/2014  . Dysmenorrhea 02/13/2014  . Patellar subluxation 01/15/2014  . Presence of subdermal contraceptive implant 02/15/2013  . Allergic rhinitis 09/19/2012  . Mild persistent asthma 09/19/2012    Rhondalyn Clingan 02/27/2014, 12:50 PM

## 2014-02-27 NOTE — Patient Instructions (Signed)
Add hip ER to SLR

## 2014-02-28 ENCOUNTER — Telehealth (HOSPITAL_COMMUNITY): Payer: Self-pay

## 2014-02-28 NOTE — ED Notes (Signed)
Post ED Visit - Positive Culture Follow-up  Culture report reviewed by antimicrobial stewardship pharmacist: [x]  Wes Walnut, Pharm.D., BCPS []  Heide Guile, Pharm.D., BCPS []  Alycia Rossetti, Pharm.D., BCPS []  Zuehl, Pharm.D., BCPS, AAHIVP []  Legrand Como, Pharm.D., BCPS, AAHIVP []  Isac Sarna, Pharm.D., BCPS  Positive urine culture Treated with cipro, organism sensitive to the same and no further patient follow-up is required at this time.  Stacey Moyer 02/28/2014, 10:31 AM

## 2014-03-01 ENCOUNTER — Ambulatory Visit: Payer: Self-pay | Admitting: Physical Therapy

## 2014-03-01 DIAGNOSIS — S8392XA Sprain of unspecified site of left knee, initial encounter: Secondary | ICD-10-CM

## 2014-03-01 NOTE — Therapy (Signed)
[facility]patient [facility] [address] [place], [place], [address] Phone: [phone]   Fax:  [phone]  Physical Therapy Treatment  Patient Details  Name: Stacey Moyer MRN: [id number] Date of Birth: Nov 16, 1995  Encounter Date: 03/01/2014      PT End of Session - 03/01/14 1238    Visit Number 5   Number of Visits 16   Date for PT Re-Evaluation 03/29/14   PT Start Time 1140   PT Stop Time 1234   PT Time Calculation (min) 54 min   Activity Tolerance Patient tolerated treatment well  I feel like i did something today      Past Medical History  Diagnosis Date  . Asthma   . Depression   . Anxiety     Past Surgical History  Procedure Laterality Date  . Tooth extraction      LMP 02/19/2014 (Exact Date)  Visit Diagnosis:  Knee sprain and strain, left, initial encounter      Subjective Assessment - 03/01/14 1156    Symptoms No pain today, is a 2/10 when i wake up but it goes away quickly. It hurt really bad yesterday. I have to be "without restrictions to go back to work"   How long can you stand comfortably? 30 min   How long can you walk comfortably? 20--30 min without crutches            [facility] Adult PT Treatment/Exercise - 03/01/14 1200    Knee/Hip Exercises: Stretches   Soleus Stretch Limitations sidelying Reformer single leg press 1 Red 1 Yellow for knee stab.    Knee/Hip Exercises: Aerobic   Stationary Bike --  level 1, 6 min   Knee/Hip Exercises: Standing   Other Standing Knee Exercises --  scooter for standing balance, quad strength 1 Red   Knee/Hip Exercises: Supine   Other Supine Knee Exercises Pilates Reformer for LE strenght and alignment. Footwork 2 Red 1 Blue, single and double leg, bridging    Other Supine Knee Exercises Feet in straps arcs, circles   Cryotherapy   Number Minutes Cryotherapy 10 Minutes   Cryotherapy Location Knee   Type of Cryotherapy Ice pack          PT Education - 03/01/14 1238    Education  Details PIlates and reformer   Person(s) Educated Patient   Methods Explanation;Demonstration;Verbal cues;Tactile cues   Comprehension Verbalized understanding;Returned demonstration;Need further instruction              Plan - 03/01/14 1239    Clinical Impression Statement Patient lacks core stability and knee/hip strength, self reports no exercise, being "lazy" besides dancing.  She tolerated ex well today, despite fatigue.    PT Next Visit Plan SLR with hip ER, standing balance/stability ex, try Pilates equipment again favorable   PT Home Exercise Plan continue as previous but add hip ER for VMO   Consulted and Agree with Plan of Care Patient      Problem List Patient Active Problem List   Diagnosis Date Noted  . Postinflammatory hypopigmentation 02/13/2014  . Dysmenorrhea 02/13/2014  . Patellar subluxation 01/15/2014  . Presence of subdermal contraceptive implant 02/15/2013  . Allergic rhinitis 09/19/2012  . Mild persistent asthma 09/19/2012    Ibrohim Simmers 03/01/2014, 12:44 PM  Raeford Razor, PT 03/01/2014 12:45 PM Phone: [phone] Fax: [phone]

## 2014-03-05 ENCOUNTER — Ambulatory Visit: Payer: Self-pay | Admitting: Physical Therapy

## 2014-03-05 DIAGNOSIS — S8392XA Sprain of unspecified site of left knee, initial encounter: Secondary | ICD-10-CM

## 2014-03-05 NOTE — Therapy (Signed)
[place], [place], [address] Phone: [phone]   Fax:  [phone]  Physical Therapy Treatment  Patient Details  Name: Stacey Moyer MRN: [id number] Date of Birth: Apr 28, 1995  Encounter Date: 03/05/2014      PT End of Session - 03/05/14 0926    Visit Number 6   Number of Visits 16   PT Start Time 0846   PT Stop Time 0927   PT Time Calculation (min) 41 min   Activity Tolerance Patient tolerated treatment well      Past Medical History  Diagnosis Date  . Asthma   . Depression   . Anxiety     Past Surgical History  Procedure Laterality Date  . Tooth extraction      LMP 02/19/2014 (Exact Date)  Visit Diagnosis:  Knee sprain and strain, left, initial encounter      Subjective Assessment - 03/05/14 0850    Symptoms Its early! I am sore.  Was really sore Saturday, unsure of reason.    How long can you stand comfortably? 1 hour   How long can you walk comfortably? going to the mall   Currently in Pain? No/denies   Pain Score 0-No pain          [facility] PT Assessment - 03/05/14 0918    AROM   Left Knee Extension --  -9 quad lag   Strength   Right Hip ABduction 4/5   Left Hip ABduction --  4+/5                  [facility] Adult PT Treatment/Exercise - 03/05/14 0858    Knee/Hip Exercises: Aerobic   Stationary Bike --  5 min level 2   Knee/Hip Exercises: Machines for Strengthening   Cybex Knee Extension --  too painful 1 plate   Knee/Hip Exercises: Standing   Forward Lunges Both;1 set;10 reps   Forward Lunges Limitations --  BOSU   Functional Squat 1 set;10 reps  BOSU   Other Standing Knee Exercises march on BOSU with 1-2 UE   Knee/Hip Exercises: Seated   Long Arc Quad Strengthening;Left;2 sets;10 reps  unable to lift 8 lbs without pain   Long Arc Quad Weight --  0   Knee/Hip Exercises: Supine   Short Arc Quad Sets Left;1 set;20 reps   Short Arc [employer] Limitations --   6 lbs   Straight Leg Raises Left;1 set;20 reps   Straight Leg Raise with External Rotation Left;1 set;20 reps   Knee/Hip Exercises: Sidelying   Hip ABduction Right;Left;20 reps   Cryotherapy   Number Minutes Cryotherapy 10 Minutes   Cryotherapy Location Knee   Type of Cryotherapy Ice pack     Therapeutic taping to Lt. Knee: McConnell taping for lateral tracking K-tape for edema medial L. Knee            PT Education - 03/05/14 0926    Education provided No          PT Short Term Goals - 02/27/14 1240    PT SHORT TERM GOAL #1   Title Pt. will be I with initial HEP for Lt. knee   Status Achieved   PT SHORT TERM GOAL #2   Title Walk without crutches and min pain increase   Status Achieved   PT SHORT TERM GOAL #3   Title Pt. will achieve full PROM    Status Achieved   PT SHORT TERM GOAL #4   Title Pt.  will demo 4/5 quad strength to ease gait, mobility on campus   Status On-going           PT Long Term Goals - 03/05/14 0857    PT LONG TERM GOAL #3   Title Walk without limp or device as needed in the community and min occ pain   Status Achieved   PT LONG TERM GOAL #4   Title Return to light activitiy, dance with min occasional pain as guided by MD   Status On-going               Plan - 03/05/14 0947    Clinical Impression Statement Patient cont with core and knee weakness with level 1 ex.  She has been able to resume normal walking actviity without a pain increase, however, she likely relies on ligamentous support rather than muscular.    Rehab Potential Excellent   PT Frequency 2x / week   PT Duration 8 weeks   PT Treatment/Interventions ADLs/Self Care Home Management;Moist Heat;Patient/family education;Passive range of motion;Therapeutic exercise;DME Instruction;Ultrasound;Gait training;Manual techniques;Neuromuscular re-education;Stair training;Cryotherapy;Electrical Stimulation;Functional mobility training   PT Next Visit Plan SLR with hip ER,  standing balance/stability ex, try Pilates equipment again favorable   PT Home Exercise Plan cont   Consulted and Agree with Plan of Care Patient        Problem List Patient Active Problem List   Diagnosis Date Noted  . Postinflammatory hypopigmentation 02/13/2014  . Dysmenorrhea 02/13/2014  . Patellar subluxation 01/15/2014  . Presence of subdermal contraceptive implant 02/15/2013  . Allergic rhinitis 09/19/2012  . Mild persistent asthma 09/19/2012    Veronda Gabor 03/05/2014, 9:53 AM  [facility] [address] [place], [place], [address] Phone: [phone]   Fax:  [phone]   Raeford Razor, PT 03/05/2014 9:55 AM Phone: [phone] Fax: [phone]

## 2014-03-07 ENCOUNTER — Ambulatory Visit: Payer: Self-pay | Admitting: Physical Therapy

## 2014-03-15 ENCOUNTER — Encounter: Payer: Self-pay | Admitting: Sports Medicine

## 2014-03-15 ENCOUNTER — Ambulatory Visit (INDEPENDENT_AMBULATORY_CARE_PROVIDER_SITE_OTHER): Payer: Self-pay | Admitting: Sports Medicine

## 2014-03-15 VITALS — BP 110/69 | Ht 63.0 in | Wt 143.0 lb

## 2014-03-15 DIAGNOSIS — S83002D Unspecified subluxation of left patella, subsequent encounter: Secondary | ICD-10-CM

## 2014-03-15 NOTE — Assessment & Plan Note (Signed)
Patient presented today for follow-up patella subluxation  Recommendations: -Encouraged patient to continue physical therapy to work on strengthening and proprioceptive activities for stabilization of her knee. -Encouraged patient that she may return to dance, running as tolerated. -Provided patient with a patella stabilization brace for additional support with more aggressive activities -Provided patient a work note to return to work with no instructions -Patient will follow-up when necessary. Advised patient the possibility of repeat injury with dislocation of her patella. Educated the patient that if this occurs again she will likely need to be referred for surgical intervention as conservative management with physical therapy and bracing has failed

## 2014-03-15 NOTE — Progress Notes (Signed)
  Stacey Moyer - 18 y.o. female MRN [id number]  Date of birth: 02-05-1996  SUBJECTIVE:  Including CC & ROS.  Stacey Moyer is a 18 year old Hispanic female presents today for follow-up left knee patella subluxation. Original injury occurred on 01/04/14 during dance class. Since her previous visit patient has returned to weightbearing activity and is off crutches. She's been working with physical therapy on 6/16 visits. She reports she's been working with physical therapy on strengthening and has been able to resume normal activity such as walking pain-free and biking pain-free. She denies any pain today or soreness. Has had no additional episodes of subluxation since her last office visit. She would like to return to work with no restrictions.   ROS: Review of systems otherwise negative except for information present in HPI  HISTORY: Past Medical, Surgical, Social, and Family History Reviewed & Updated per EMR. Pertinent Historical Findings include: Asthma, depression, anxiety  DATA REVIEWED: Personally reviewed patient's 4 view x-rays him October that were unremarkable. However there is no sunrise view to evaluate the patella femoral articulation with the shallowness of the trochlear groove.  PHYSICAL EXAM:  VS: BP:110/69 mmHg  HR: bpm  TEMP: ( )  RESP:   HT:5\' 3"  (160 cm)   WT:143 lb (64.864 kg)  BMI:25.4 KNEE EXAM:  General: well nourished Skin of LE: warm; dry, no rashes, lesions, ecchymosis or erythema. Vascular: Dorsal pedal pulses 2+ bilaterally Neurologically: Sensation to light touch lower extremities equal and intact bilaterally.  Observation: mild +1 knee effusion visualized, no previous surgical scars, patella in place, no quad muscle atrophy Palpation:  No pain along the lateral joint line No pain along the medial joint line Range of motion:  Full knee flexion to approximately 140, full extension 0, normal patellar tracking Ligamentous testing: ligamentous stablity of ACL,  MCL, LCL, and PCL intact. Positive patella apprehension test - evidence of medial retinacular attachment more mobility Meniscal evaluation: Negative McMurray's test, Negative thessaly's test, Normal gait  ASSESSMENT & PLAN: See problem based charting & AVS for pt instructions.

## 2014-03-19 ENCOUNTER — Ambulatory Visit: Payer: No Typology Code available for payment source | Attending: Family Medicine | Admitting: Physical Therapy

## 2014-03-19 DIAGNOSIS — J45909 Unspecified asthma, uncomplicated: Secondary | ICD-10-CM | POA: Insufficient documentation

## 2014-03-19 DIAGNOSIS — F419 Anxiety disorder, unspecified: Secondary | ICD-10-CM | POA: Insufficient documentation

## 2014-03-19 DIAGNOSIS — Z5189 Encounter for other specified aftercare: Secondary | ICD-10-CM | POA: Insufficient documentation

## 2014-03-19 DIAGNOSIS — S8392XA Sprain of unspecified site of left knee, initial encounter: Secondary | ICD-10-CM

## 2014-03-19 DIAGNOSIS — F329 Major depressive disorder, single episode, unspecified: Secondary | ICD-10-CM | POA: Insufficient documentation

## 2014-03-19 DIAGNOSIS — S83003D Unspecified subluxation of unspecified patella, subsequent encounter: Secondary | ICD-10-CM | POA: Insufficient documentation

## 2014-03-19 DIAGNOSIS — S838X2A Sprain of other specified parts of left knee, initial encounter: Secondary | ICD-10-CM | POA: Insufficient documentation

## 2014-03-19 NOTE — Therapy (Signed)
[place], [place], [address] Phone: [phone]   Fax:  [phone]  Physical Therapy Treatment  Patient Details  Name: Stacey Moyer MRN: [id number] Date of Birth: 1995-07-27  Encounter Date: 03/19/2014      PT End of Session - 03/19/14 1314    PT Start Time 1027   PT Stop Time 1110   PT Time Calculation (min) 43 min   Activity Tolerance Patient tolerated treatment well      Past Medical History  Diagnosis Date  . Asthma   . Depression   . Anxiety     Past Surgical History  Procedure Laterality Date  . Tooth extraction      LMP 02/19/2014 (Exact Date)  Visit Diagnosis:  Knee sprain and strain, left, initial encounter      Subjective Assessment - 03/19/14 1029    Symptoms 3/10 pain with jumping with bowling.  Brief   Pain Location Knee   Pain Descriptors / Indicators --  pain   Pain Type Chronic pain   Effect of Pain on Daily Activities jumping hurts   Multiple Pain Sites No                    [facility] Adult PT Treatment/Exercise - 03/19/14 1036    Knee/Hip Exercises: Stretches   ITB Stretch 3 reps;20 seconds  standing   Gastroc Stretch 1 rep;30 seconds   Soleus Stretch 1 rep;30 seconds   Knee/Hip Exercises: Machines for Strengthening   Cybex Knee Flexion --  35 Lbs 3 sets Lt only   Knee/Hip Exercises: Standing   Heel Raises 2 sets  Lt only   Forward Lunges Both;1 set  Pole and multiple cues   Forward Lunges Limitations --  small motions, poor trunk control, 10 reps   Knee/Hip Exercises: Seated   Long Arc Quad Weight --  10 Lbs. 10 reps Assisted    Cryotherapy   Number Minutes Cryotherapy 10 Minutes   Cryotherapy Location Knee  Lt   Type of Cryotherapy Ice pack                PT Education - 03/19/14 1103    Education provided No          PT Short Term Goals - 03/19/14 1058    PT SHORT TERM GOAL #4   Title Pt. will demo 4/5 quad strength to ease  gait, mobility on campus   Time 4   Period Weeks   Status Achieved           PT Long Term Goals - 03/19/14 1059    PT LONG TERM GOAL #4   Title Return to light activitiy, dance with min occasional pain as guided by MD   Time 8   Status Unable to assess   PT LONG TERM GOAL #5   Title Quad strength 5/5   Time 8   Period Weeks   Status On-going   PT LONG TERM GOAL #6   Title Stairs   Status Achieved               Plan - 03/19/14 1315    Clinical Impression Statement pain improving, focuc today on Strengthening.  4/5 strength Lt quads.  Needs more core and Leg strengthening.   PT Next Visit Plan side planks.  lunges  strengthening.  See how dance class went.   Consulted and Agree with Plan of Care Patient  Problem List Patient Active Problem List   Diagnosis Date Noted  . Postinflammatory hypopigmentation 02/13/2014  . Dysmenorrhea 02/13/2014  . Patellar subluxation 01/15/2014  . Presence of subdermal contraceptive implant 02/15/2013  . Allergic rhinitis 09/19/2012  . Mild persistent asthma 09/19/2012  Melvenia Needles, PTA 03/19/2014 1:19 PM Phone: [phone] Fax: [phone]   [facility] 03/19/2014, 1:19 PM  [place], [place], [address] Phone: [phone]   Fax:  [phone]

## 2014-03-19 NOTE — Telephone Encounter (Signed)
appts made and printed...td 

## 2014-03-27 ENCOUNTER — Ambulatory Visit: Payer: No Typology Code available for payment source | Admitting: Physical Therapy

## 2014-03-28 ENCOUNTER — Ambulatory Visit: Payer: Self-pay | Admitting: Pediatrics

## 2014-03-28 ENCOUNTER — Ambulatory Visit: Payer: No Typology Code available for payment source | Admitting: Physical Therapy

## 2014-03-28 ENCOUNTER — Ambulatory Visit (INDEPENDENT_AMBULATORY_CARE_PROVIDER_SITE_OTHER): Payer: Self-pay | Admitting: Pediatrics

## 2014-03-28 ENCOUNTER — Encounter: Payer: Self-pay | Admitting: Pediatrics

## 2014-03-28 ENCOUNTER — Ambulatory Visit: Payer: Medicaid Other | Admitting: Pediatrics

## 2014-03-28 VITALS — Temp 97.7°F | Wt 144.4 lb

## 2014-03-28 DIAGNOSIS — J4531 Mild persistent asthma with (acute) exacerbation: Secondary | ICD-10-CM

## 2014-03-28 MED ORDER — BECLOMETHASONE DIPROPIONATE 80 MCG/ACT IN AERS: 2.0000 | INHALATION_SPRAY | Freq: Two times a day (BID) | RESPIRATORY_TRACT | Status: DC

## 2014-03-28 NOTE — Progress Notes (Signed)
   Subjective:     Stacey Moyer, is a 19 y.o. female  HPI  Current illness: cough for 3-4 days with tightness in the chest, albuterol hasn't helped much, hasn't taken anything lately.  Fever: no thermometer, had chills last pm   Vomiting: no Diarrhea: no No sore throat, no HA, no myalgias. Appetite  Normal?: no, less hungry than usual UOP normal?: normal, no more dysuria.   Ill contacts: Works in [employer] at [employer].  Smoke exposure; no, does not smoke Day care:  Is a daycare provider ar [employer] Travel out of city: no  Asthma: Used to have moderate persistent asthma and used to use Advair, but not for several years. Currently uses Cetirizine and Singulair daily. Used Albuterol before traditional [place] dance practice 3 times a week Hasn't used a spacer in years.   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     Objective:     Physical Exam  Constitutional: She appears well-developed and well-nourished. No distress.  HENT:  Head: Normocephalic and atraumatic.  Right Ear: External ear normal.  Left Ear: External ear normal.  Nose: Nose normal.  Mouth/Throat: Oropharynx is clear and moist.  Eyes: Conjunctivae and EOM are normal. Right eye exhibits no discharge. Left eye exhibits no discharge.  Neck: Normal range of motion.  Cardiovascular: Normal rate, regular rhythm and normal heart sounds.   Pulmonary/Chest: No respiratory distress. She has no wheezes. She has no rales.    Abdominal: Soft. She exhibits no distension. There is no tenderness.  Lymphadenopathy:    She has no cervical adenopathy.  Skin: Skin is warm and dry. No rash noted.  Nursing note and vitals reviewed.      Assessment & Plan:   1. Mild persistent asthma, with acute exacerbation No wheezing on exam, but chest feels tight and is coughing during room. Ok to continue prn Albuterol up to every 4 hours, but to add a spacer for improved med delivery to lungs.  Also to add Qvar for decreased frequency of albuterol use and improved exercise induced asthma control  - beclomethasone (QVAR) 80 MCG/ACT inhaler; Inhale 2 puffs into the lungs 2 (two) times daily.  Dispense: 1 Inhaler; Refill: 12  Supportive care and return precautions reviewed.   Stacey Messier, MD

## 2014-04-06 ENCOUNTER — Ambulatory Visit: Payer: No Typology Code available for payment source | Admitting: Physical Therapy

## 2014-04-12 ENCOUNTER — Ambulatory Visit: Payer: No Typology Code available for payment source

## 2014-04-12 DIAGNOSIS — S8392XA Sprain of unspecified site of left knee, initial encounter: Secondary | ICD-10-CM

## 2014-04-12 NOTE — Therapy (Signed)
[place], [place], [address] Phone: [phone]   Fax:  [phone]  Physical Therapy Treatment  Patient Details  Name: Stacey Moyer MRN: [id number] Date of Birth: 1995/07/26 Referring Provider:  Ok Edwards, MD  Encounter Date: 04/12/2014      PT End of Session - 04/12/14 1032    Visit Number 8   Number of Visits 16   Date for PT Re-Evaluation 05/13/13   PT Start Time 0930   PT Stop Time 1015   PT Time Calculation (min) 45 min   Activity Tolerance Patient tolerated treatment well   Behavior During Therapy [facility] for tasks assessed/performed      Past Medical History  Diagnosis Date  . Asthma   . Depression   . Anxiety     Past Surgical History  Procedure Laterality Date  . Tooth extraction      LMP 03/20/2014  Visit Diagnosis:  Knee sprain and strain, left, initial encounter      Subjective Assessment - 04/12/14 0937    Symptoms She was ill .  1/10 pain. She has some mild pain with dancing but does normal routine. She uses brace for this   Currently in Pain? Yes   Pain Score 1    Pain Location Knee   Pain Orientation Left   Pain Type Chronic pain   Pain Onset More than a month ago   Pain Frequency Intermittent   Aggravating Factors  heavieer activity on feet, dance   Pain Relieving Factors stretch ,cold   Multiple Pain Sites No          [facility] PT Assessment - 04/12/14 1036    PROM   Left Knee Extension 180   Strength   Right Hip ABduction 4+/5   Left Hip ABduction 4+/5   Left Knee Flexion 5/5   Left Knee Extension 4/5  with patella pain                  [facility] Adult PT Treatment/Exercise - 04/12/14 0944    Knee/Hip Exercises: Aerobic   Stationary Bike L3 5 min pace 40 RPM   Knee/Hip Exercises: Standing   Heel Raises 1 set;15 reps   Forward Lunges Right;Left;1 set;10 reps  tactile and verbal cues needed  an done in bars   Knee/Hip Exercises: Supine    Bridges AROM;Both;1 set;10 reps   Bridges Limitations shoulder bridge   Knee/Hip Exercises: Sidelying   Hip ABduction AROM;Right;Left;2 sets;10 reps   Clams 12 reps with cues for pelvic stability and alignment   Knee/Hip Exercises: Prone   Other Prone Exercises prone on elbows plank 10 seconds x5                PT Education - 04/12/14 1031    Education provided Yes   Education Details bridge and Runner, broadcasting/film/video) Educated Patient   Methods Explanation;Demonstration;Tactile cues;Verbal cues;Handout   Comprehension Verbalized understanding;Returned demonstration;Need further instruction          PT Short Term Goals - 04/12/14 1011    PT SHORT TERM GOAL #1   Title Pt. will be I with initial HEP for Lt. knee   Status Achieved   PT SHORT TERM GOAL #2   Title Walk without crutches and min pain increase   Status Achieved   PT SHORT TERM GOAL #3   Title Pt. will achieve full PROM    Status Achieved   PT SHORT TERM GOAL #4  Title Pt. will demo 4/5 quad strength to ease gait, mobility on campus   Status Achieved           PT Long Term Goals - 04/12/14 1011    PT LONG TERM GOAL #1   Title Pt. wil be I with advanced HEP for LE. LE   Status On-going   PT LONG TERM GOAL #2   Title Pt. will use and understanding RICE to prevent further injury   Status Achieved   PT LONG TERM GOAL #3   Title Walk without limp or device as needed in the community and min occ pain   Status Achieved   PT LONG TERM GOAL #4   Title Return to light activitiy, dance with min occasional pain as guided by MD   Status Achieved   PT LONG TERM GOAL #5   Title Quad strength 5/5   Status On-going   Additional Long Term Goals   Additional Long Term Goals Yes   PT LONG TERM GOAL #7   Title return to running with min 91/10)pain with brace   Time 4   Period Weeks   Status New   PT LONG TERM GOAL #8   Title jump with 1/10 pain in knee with brace   Time 4   Period Weeks   Status New                Plan - 04/12/14 1033    Clinical Impression Statement She is improved with return to danceing but with pain limiting running and jumping. Still weak in quads and obvious core weakness   Pt will benefit from skilled therapeutic intervention in order to improve on the following deficits Abnormal gait;Decreased range of motion;Difficulty walking;Impaired flexibility;Decreased activity tolerance;Decreased knowledge of use of DME;Decreased strength;Decreased mobility   Rehab Potential Excellent   PT Frequency 2x / week   PT Duration 4 weeks   PT Treatment/Interventions ADLs/Self Care Home Management;Moist Heat;Patient/family education;Passive range of motion;Therapeutic exercise;DME Instruction;Ultrasound;Gait training;Manual techniques;Neuromuscular re-education;Stair training;Cryotherapy;Electrical Stimulation;Functional mobility training   PT Next Visit Plan Review all exercise HEP. PRogress core strength   PT Home Exercise Plan Review and progress as able . Add to core strength if appropriate   Consulted and Agree with Plan of Care Patient        Problem List Patient Active Problem List   Diagnosis Date Noted  . Postinflammatory hypopigmentation 02/13/2014  . Dysmenorrhea 02/13/2014  . Patellar subluxation 01/15/2014  . Presence of subdermal contraceptive implant 02/15/2013  . Allergic rhinitis 09/19/2012  . Mild persistent asthma 09/19/2012    Darrel Hoover PT 04/12/2014, 10:38 AM  [facility] [address] [place], [place], [address] Phone: [phone]   Fax:  [phone]

## 2014-04-12 NOTE — Patient Instructions (Addendum)
Issued shoulder bridge and on elbow prone plank for HEP 5-10 reps 5-10 sec hold. Daily.   Asked her to bring in all HEP to be reviewed

## 2014-04-17 ENCOUNTER — Ambulatory Visit: Payer: No Typology Code available for payment source | Attending: Family Medicine | Admitting: Physical Therapy

## 2014-04-17 DIAGNOSIS — F419 Anxiety disorder, unspecified: Secondary | ICD-10-CM | POA: Insufficient documentation

## 2014-04-17 DIAGNOSIS — F329 Major depressive disorder, single episode, unspecified: Secondary | ICD-10-CM | POA: Insufficient documentation

## 2014-04-17 DIAGNOSIS — S838X2A Sprain of other specified parts of left knee, initial encounter: Secondary | ICD-10-CM | POA: Insufficient documentation

## 2014-04-17 DIAGNOSIS — J45909 Unspecified asthma, uncomplicated: Secondary | ICD-10-CM | POA: Insufficient documentation

## 2014-04-17 DIAGNOSIS — Z5189 Encounter for other specified aftercare: Secondary | ICD-10-CM | POA: Insufficient documentation

## 2014-04-17 DIAGNOSIS — S83003D Unspecified subluxation of unspecified patella, subsequent encounter: Secondary | ICD-10-CM | POA: Insufficient documentation

## 2014-04-17 DIAGNOSIS — S8392XA Sprain of unspecified site of left knee, initial encounter: Secondary | ICD-10-CM

## 2014-04-17 NOTE — Patient Instructions (Signed)
Avoid/minimize jumping, running and deep squats DO Straight leg raises in neutral (toes up) and toes out, planks  Wear brace with dancing At the gym do bike, TM and knee ext machine, leg press   Quad Strength: Quarter Squat   With feet shoulder-width apart and back against wall, slide down wall until knees are at 30-45. Return. Repeat __5-10__ times.  HOLD 10 SEC Do ____1 sessions per day. CAUTION: You should not bend knees deep enough to cause pain.  http://cc.exer.us/10   Copyright  VHI. All rights reserved.    http://cc.exer.us/10   Copyright  VHI. All rights reserved.  Quad Strength (VMO): Quarter Squat External Hip Rotation With Tubing   With feet shoulder-width apart and out at 45, stand on tubing and pull to hip height. Bend knees to 30-45. Return. Repeat __10-20__ times or for ____ minutes. Do __1__ sessions per day. CAUTION: You should not bend knees deep enough to cause pain.  http://cc.exer.us/16   Copyright  VHI. All rights reserved.

## 2014-04-17 NOTE — Therapy (Signed)
[place], [place], [address] Phone: [phone]   Fax:  [phone]  Physical Therapy Treatment  Patient Details  Name: Stacey Moyer MRN: [id number] Date of Birth: Feb 20, 1996 Referring Provider:  Dickie La, MD  Encounter Date: 04/17/2014      PT End of Session - 04/17/14 1236    Visit Number 9   Number of Visits 16   Date for PT Re-Evaluation 05/13/13   PT Start Time 9735   PT Stop Time 1233   PT Time Calculation (min) 40 min   Activity Tolerance Patient tolerated treatment well      Past Medical History  Diagnosis Date  . Asthma   . Depression   . Anxiety     Past Surgical History  Procedure Laterality Date  . Tooth extraction      LMP 03/20/2014  Visit Diagnosis:  Knee sprain and strain, left, initial encounter      Subjective Assessment - 04/17/14 1154    Symptoms Pain has increased a bit more, unsure why.  She does alot of jumping for dance.    How long can you sit comfortably? not a problem   How long can you stand comfortably? works full days, pain increases with doing alot of dancing   How long can you walk comfortably? a little tired, min pain   Currently in Pain? Yes   Pain Score 2    Pain Location Knee   Pain Orientation Left   Pain Descriptors / Indicators Aching   Pain Type Chronic pain   Pain Onset More than a month ago   Pain Frequency Intermittent   Aggravating Factors  pressure (manual) to knee, dancing/jumping   Pain Relieving Factors rest, cold   Multiple Pain Sites No          [facility] PT Assessment - 04/17/14 1201    Strength   Left Knee Flexion 5/5   Left Knee Extension 4/5  with patella pain          [facility] Adult PT Treatment/Exercise - 04/17/14 1208    Knee/Hip Exercises: Machines for Strengthening   Cybex Knee Extension 1 plate x 10 pain in ant patella   Cybex Leg Press 2 plates x 20   Knee/Hip Exercises: Standing   Wall Squat 10 reps   Wall  Squat Limitations --  10 sec hold, added toe taps   Knee/Hip Exercises: Supine   Straight Leg Raises Left;1 set;15 reps   Straight Leg Raise with External Rotation Left;1 set;10 reps    Self care: discussed at length: safe exercises for trainer, things to avoid, wearing brace Plan of care, jumping and prognosis      PT Education - 04/17/14 1233    Education provided Yes   Education Details quad strength and safety with trainer   Person(s) Educated Patient   Methods Explanation;Demonstration   Comprehension Verbalized understanding          PT Short Term Goals - 04/12/14 1011    PT SHORT TERM GOAL #1   Title Pt. will be I with initial HEP for Lt. knee   Status Achieved   PT SHORT TERM GOAL #2   Title Walk without crutches and min pain increase   Status Achieved   PT SHORT TERM GOAL #3   Title Pt. will achieve full PROM    Status Achieved   PT SHORT TERM GOAL #4   Title Pt. will demo 4/5 quad strength to ease  gait, mobility on campus   Status Achieved           PT Long Term Goals - 04/17/14 1223    PT LONG TERM GOAL #1   Title Pt. wil be I with advanced HEP for LE. LE   Status Achieved   PT LONG TERM GOAL #2   Title Pt. will use and understanding RICE to prevent further injury   Status Achieved   PT LONG TERM GOAL #3   Title Walk without limp or device as needed in the community and min occ pain   Status Achieved   PT LONG TERM GOAL #4   Title Return to light activitiy, dance with min occasional pain as guided by MD   Status Achieved   PT LONG TERM GOAL #5   Title Quad strength 5/5  4+/5   Status Not Met   PT LONG TERM GOAL #6   Title Stairs without difficulty   Time 2   Status On-going   PT LONG TERM GOAL #7   Title return to running with min 91/10)pain with brace   Status On-going   PT LONG TERM GOAL #8   Title jump with 1/10 pain in knee with brace   Status On-going               Plan - 04/17/14 1227    Clinical Impression Statement  Patient will finish scheduled appts.  She was given strengthening ex today. Quad strength improving, working on core strength at home and with friend who is a Clinical research associate.    PT Next Visit Plan jumping on reformer   PT Home Exercise Plan Review and progress as able . Add to core strength if appropriate        Problem List Patient Active Problem List   Diagnosis Date Noted  . Postinflammatory hypopigmentation 02/13/2014  . Dysmenorrhea 02/13/2014  . Patellar subluxation 01/15/2014  . Presence of subdermal contraceptive implant 02/15/2013  . Allergic rhinitis 09/19/2012  . Mild persistent asthma 09/19/2012    PAA,JENNIFER 04/17/2014, 12:41 PM  [place] [facility] [address] [place], [place], [address] Phone: [phone]   Fax:  [phone]

## 2014-04-19 ENCOUNTER — Ambulatory Visit: Payer: No Typology Code available for payment source | Admitting: Rehabilitation

## 2014-04-19 DIAGNOSIS — S8392XA Sprain of unspecified site of left knee, initial encounter: Secondary | ICD-10-CM

## 2014-04-19 NOTE — Therapy (Addendum)
[place], [place], [address] Phone: [phone]   Fax:  [phone]  Physical Therapy Treatment  Patient Details  Name: Manuelita Moxon MRN: [id number] Date of Birth: [birth year] Referring Provider:  Ok Edwards, MD  Encounter Date: 04/19/2014      PT End of Session - 04/19/14 0747    Visit Number 10   Number of Visits 16   Date for PT Re-Evaluation 05/13/13   PT Start Time 0736   PT Stop Time 0805   PT Time Calculation (min) 29 min      Past Medical History  Diagnosis Date  . Asthma   . Depression   . Anxiety     Past Surgical History  Procedure Laterality Date  . Tooth extraction      LMP 03/20/2014  Visit Diagnosis:  Knee sprain and strain, left, initial encounter      Subjective Assessment - 04/19/14 0746    Symptoms It was hurting more at dance practice yesterday. Today left knee anterior 3-4/10. Also right calf soreness.                    [place] Adult PT Treatment/Exercise - 04/19/14 0748    Knee/Hip Exercises: Aerobic   Stationary Bike L3 5 min pace 40 RPM   Knee/Hip Exercises: Machines for Strengthening   Cybex Knee Extension 1 plate x 10 pain in ant patella   Cybex Leg Press 2 plates  x 20 bilat then 2plate concentric and Left only eccentric x 10, 1 plate left only x    Knee/Hip Exercises: Standing   Heel Raises 20 reps  edge of step   Forward Lunges Right;Left;1 set;10 reps  tactile and verbal cues needed  an done at counter   Lateral Step Up 10 reps;Hand Hold: 1;Step Height: 4"   Forward Step Up 10 reps;Hand Hold: 1;Step Height: 6"   Step Down 1 set;10 reps;Hand Hold: 1;Step Height: 4"                  PT Short Term Goals - 04/12/14 1011    PT SHORT TERM GOAL #1   Title Pt. will be I with initial HEP for Lt. knee   Status Achieved   PT SHORT TERM GOAL #2   Title Walk without crutches and min pain increase   Status Achieved   PT SHORT TERM GOAL  #3   Title Pt. will achieve full PROM    Status Achieved   PT SHORT TERM GOAL #4   Title Pt. will demo 4/5 quad strength to ease gait, mobility on campus   Status Achieved           PT Long Term Goals - 04/17/14 1223    PT LONG TERM GOAL #1   Title Pt. wil be I with advanced HEP for LE. LE   Status Achieved   PT LONG TERM GOAL #2   Title Pt. will use and understanding RICE to prevent further injury   Status Achieved   PT LONG TERM GOAL #3   Title Walk without limp or device as needed in the community and min occ pain   Status Achieved   PT LONG TERM GOAL #4   Title Return to light activitiy, dance with min occasional pain as guided by MD   Status Achieved   PT LONG TERM GOAL #5   Title Quad strength 5/5  4+/5   Status Not Met   PT  LONG TERM GOAL #6   Title Stairs without difficulty   Time 2   Status On-going   PT LONG TERM GOAL #7   Title return to running with min 91/10)pain with brace   Status On-going   PT LONG TERM GOAL #8   Title jump with 1/10 pain in knee with brace   Status On-going               Plan - 04/19/14 0805    Clinical Impression Statement continued quad weakness   PT Next Visit Plan jumping on reformer FOTO NEXT VISIT        Problem List Patient Active Problem List   Diagnosis Date Noted  . Postinflammatory hypopigmentation 02/13/2014  . Dysmenorrhea 02/13/2014  . Patellar subluxation 01/15/2014  . Presence of subdermal contraceptive implant 02/15/2013  . Allergic rhinitis 09/19/2012  . Mild persistent asthma 09/19/2012    Dorene Ar, PTA 04/19/2014, 11:14 AM  [facility] [address] [place], [place], [address] Phone: [phone]   Fax:  [phone]

## 2014-04-24 ENCOUNTER — Encounter: Payer: Self-pay | Admitting: Rehabilitation

## 2014-04-26 ENCOUNTER — Ambulatory Visit: Payer: No Typology Code available for payment source | Admitting: Rehabilitation

## 2014-04-26 ENCOUNTER — Telehealth: Payer: Self-pay | Admitting: *Deleted

## 2014-04-26 DIAGNOSIS — S8392XA Sprain of unspecified site of left knee, initial encounter: Secondary | ICD-10-CM

## 2014-04-26 NOTE — Therapy (Signed)
[place], [place], [address] Phone: [phone]   Fax:  [phone]  Physical Therapy Treatment  Patient Details  Name: Stacey Moyer MRN: [id number] Date of Birth: 1995/10/04 Referring Provider:  Ok Edwards, MD  Encounter Date: 04/26/2014      PT End of Session - 04/26/14 1258    Visit Number 11   Number of Visits 16   Date for PT Re-Evaluation 05/13/13   PT Start Time 0930   PT Stop Time 1030   PT Time Calculation (min) 60 min      Past Medical History  Diagnosis Date  . Asthma   . Depression   . Anxiety     Past Surgical History  Procedure Laterality Date  . Tooth extraction      LMP 03/20/2014  Visit Diagnosis:  Knee sprain and strain, left, initial encounter      Subjective Assessment - 04/26/14 1254    Symptoms It has not been bothering me unless I am kneeling on my knees or sometimes climbing stairs.    Currently in Pain? No/denies                    [facility] Adult PT Treatment/Exercise - 04/26/14 1313    Knee/Hip Exercises: Standing   Heel Raises 20 reps  edge of step   Lateral Step Up 10 reps;15 reps;Hand Hold: 1;Step Height: 6"   Forward Step Up Left;20 reps;Step Height: 6"   Step Down 1 set;10 reps;Hand Hold: 1;Step Height: 4"   Knee/Hip Exercises: Supine   Other Supine Knee Exercises reformer see therapty note   Cryotherapy   Number Minutes Cryotherapy 15 Minutes   Cryotherapy Location Knee  Lt   Type of Cryotherapy Ice pack      Pilates Reformer used for LE/core strength, postural strength, lumbopelvic disassociation and core control.  Exercises included: Footwork: parallel and V on toes and heels Bridging 3 springs parallel and wide Jumping on spring board 1 red 2 red with cues to increase spring and land on left foot single and bilateral jumping with pt confidence increasing as she realized it did not cause increased pain.  Also exercises, pt  required cues for continued core engagement and neutral spine.            PT Short Term Goals - 04/12/14 1011    PT SHORT TERM GOAL #1   Title Pt. will be I with initial HEP for Lt. knee   Status Achieved   PT SHORT TERM GOAL #2   Title Walk without crutches and min pain increase   Status Achieved   PT SHORT TERM GOAL #3   Title Pt. will achieve full PROM    Status Achieved   PT SHORT TERM GOAL #4   Title Pt. will demo 4/5 quad strength to ease gait, mobility on campus   Status Achieved           PT Long Term Goals - 04/17/14 1223    PT LONG TERM GOAL #1   Title Pt. wil be I with advanced HEP for LE. LE   Status Achieved   PT LONG TERM GOAL #2   Title Pt. will use and understanding RICE to prevent further injury   Status Achieved   PT LONG TERM GOAL #3   Title Walk without limp or device as needed in the community and min occ pain   Status Achieved   PT LONG TERM GOAL #4  Title Return to light activitiy, dance with min occasional pain as guided by MD   Status Achieved   PT LONG TERM GOAL #5   Title Quad strength 5/5  4+/5   Status Not Met   PT LONG TERM GOAL #6   Title Stairs without difficulty   Time 2   Status On-going   PT LONG TERM GOAL #7   Title return to running with min 91/10)pain with brace   Status On-going   PT LONG TERM GOAL #8   Title jump with 1/10 pain in knee with brace   Status On-going               Plan - 04/26/14 1309    Clinical Impression Statement Pt demonstrates inability to hop/ jump and land on left foot in order to return to full participation in dance. On reformer, pt was able to perform single left and and bilateral hopping with cues for increased spring and controlled recovery.   PT Next Visit Plan jumping on reformer FOTO NEXT VISIT        Problem List Patient Active Problem List   Diagnosis Date Noted  . Postinflammatory hypopigmentation 02/13/2014  . Dysmenorrhea 02/13/2014  . Patellar subluxation  01/15/2014  . Presence of subdermal contraceptive implant 02/15/2013  . Allergic rhinitis 09/19/2012  . Mild persistent asthma 09/19/2012    Dorene Ar, PTA 04/26/2014, 1:24 PM  [facility] [address] [place], [place], [address] Phone: [phone]   Fax:  [phone]

## 2014-04-26 NOTE — Telephone Encounter (Signed)
appts made and printed...td 

## 2014-05-03 ENCOUNTER — Ambulatory Visit (INDEPENDENT_AMBULATORY_CARE_PROVIDER_SITE_OTHER): Payer: Self-pay | Admitting: Pediatrics

## 2014-05-03 ENCOUNTER — Encounter: Payer: Self-pay | Admitting: Pediatrics

## 2014-05-03 VITALS — HR 84 | Temp 98.2°F | Wt 134.8 lb

## 2014-05-03 DIAGNOSIS — J4531 Mild persistent asthma with (acute) exacerbation: Secondary | ICD-10-CM

## 2014-05-03 DIAGNOSIS — R05 Cough: Secondary | ICD-10-CM

## 2014-05-03 DIAGNOSIS — R053 Chronic cough: Secondary | ICD-10-CM

## 2014-05-03 DIAGNOSIS — R634 Abnormal weight loss: Secondary | ICD-10-CM

## 2014-05-03 MED ORDER — BENZONATATE 100 MG PO CAPS: 100.0000 mg | ORAL_CAPSULE | Freq: Three times a day (TID) | ORAL | Status: DC | PRN

## 2014-05-03 MED ORDER — BECLOMETHASONE DIPROPIONATE 80 MCG/ACT IN AERS: 2.0000 | INHALATION_SPRAY | Freq: Two times a day (BID) | RESPIRATORY_TRACT | Status: DC

## 2014-05-03 MED ORDER — FEXOFENADINE HCL 60 MG PO TABS: 60.0000 mg | ORAL_TABLET | Freq: Two times a day (BID) | ORAL | Status: DC

## 2014-05-03 MED ORDER — ALBUTEROL SULFATE HFA 108 (90 BASE) MCG/ACT IN AERS: 2.0000 | INHALATION_SPRAY | Freq: Four times a day (QID) | RESPIRATORY_TRACT | Status: DC | PRN

## 2014-05-03 NOTE — Progress Notes (Signed)
Subjective:      Stacey Moyer is a 19 y.o. female who is here for an asthma follow-up.  Recent asthma history notable for: frequent cough symptoms- daily day & night cough, not responding to albuterol Also with flare up of nasal allergies Poor appetite due to cough & nasal drainage. Lost 10 lbs in 1 month Currently using asthma medicines: Only albuterol. Not using Qvar. Using allergy meds such as zyrtec, flonase & singulair  The patient is not using a spacer with MDIs.  Current prescribed medicine:  Current Outpatient Prescriptions on File Prior to Visit  Medication Sig Dispense Refill  . albuterol (PROVENTIL HFA;VENTOLIN HFA) 108 (90 BASE) MCG/ACT inhaler Inhale 2 puffs into the lungs every 6 (six) hours as needed. For shortness of breath 1 Inhaler 1  . beclomethasone (QVAR) 80 MCG/ACT inhaler Inhale 2 puffs into the lungs 2 (two) times daily. 1 Inhaler 12  . cetirizine (ZYRTEC) 10 MG tablet TAKE 1 TABLET BY MOUTH ONCE DAILY 31 tablet PRN  . etonogestrel (NEXPLANON) 68 MG IMPL implant 1 each by Subdermal route once.    . montelukast (SINGULAIR) 10 MG tablet Take 1 tablet (10 mg total) by mouth daily. 31 tablet 11   No current facility-administered medications on file prior to visit.     Current Disease Severity  .           Number of days of school or work missed in the last month: 3.   Past Asthma history: Number of urgent/emergent visit in last year: 0.   Number of courses of oral steroids in last year: 1  Exacerbation requiring floor admission ever: Not in the past 5 years Exacerbation requiring PICU admission ever : No Ever intubated: No  Family history: Family history of atopic dermatitis: brother                            asthma: Yes brothers                            allergies: Yes brothers  Social History: History of smoke exposure:  No  Review of Systems  Constitutional: Positive for appetite change, fatigue and unexpected weight change (lost 10 lbs in  the past month). Negative for fever.  HENT: Positive for congestion and sneezing. Negative for sore throat.   Eyes: Negative for discharge.  Respiratory: Positive for cough and chest tightness. Negative for wheezing.   Gastrointestinal: Negative for nausea, vomiting, abdominal pain and diarrhea.  Skin: Negative for rash.        Objective:   Pulse 84  Temp(Src) 98.2 F (36.8 C)  Wt 134 lb 12.8 oz (61.145 kg)  SpO2 99% Physical Exam  Constitutional: She appears well-developed. No distress.  HENT:  Right Ear: External ear normal.  Left Ear: External ear normal.  Nose: Mucosal edema and rhinorrhea present. Right sinus exhibits no maxillary sinus tenderness and no frontal sinus tenderness. Left sinus exhibits no maxillary sinus tenderness and no frontal sinus tenderness.  Mouth/Throat: No oropharyngeal exudate.  Erythematous pharynx, no exudates  Eyes: Pupils are equal, round, and reactive to light.  Neck: Normal range of motion.  Cardiovascular: Normal rate, regular rhythm and normal heart sounds.   Pulmonary/Chest: Breath sounds normal. No respiratory distress. She has no wheezes. She has no rales. She exhibits no tenderness.  Abdominal: Soft. There is no tenderness.  Lymphadenopathy:    She has no  cervical adenopathy.  Skin: No rash noted.     Assessment/Plan:    Stacey Moyer is a 19 y.o. female with  . The patient is currently having an exacerbation. In general, the patient's disease is very poorly controlled.  Weight loss- 10 lbs in 1 month due to poor appetite & cough. Pt also reports to be exercising & eating healthy. Daily medications:Q-Var 19mcg 2 puffs twice per day Rescue medications: Albuterol (Proventil, Ventolin, Proair) 2 puffs as needed every 4 hours  Medication changes: resume Qvar 80 mcg 2 puffs twice daily- refilled meds. Refilled albuterol. Switched to [employer] for allergies  Discussed importance of compliance with nmeds. Tessalon  Capsules given to break the  cough cycle as [place] is exhausted due to poor sleep  Discussed distinction between quick-relief and controlled medications.  Pt and family were instructed on proper technique of spacer use. Warning signs of respiratory distress were reviewed with the patient.  Personalized, written asthma management plan given.  Dietary advice give. Herbal tea with honey for persistent cough.   Follow up in 1 month, or sooner should new symptoms or problems arise. Will reassess cough & asthma & also check weight. If continued weight loss, will need further work up & evaluation.  Loleta Chance, MD

## 2014-05-03 NOTE — Patient Instructions (Signed)
Asthma Action Plan for Stacey Moyer  Printed: 05/03/2014 [facility] Name: Loleta Chance, MD, Phone Number: [phone]  Please bring this plan to each visit to our office or the emergency room.  GREEN ZONE: Doing Well  No cough, wheeze, chest tightness or shortness of breath during the day or night Can do your usual activities  Take these long-term-control medicines each day  Qvar 80 mcg 2 puff bid Flonase 2 sprays per nostril qhs Allergra 60 mg at night Singulair 10 mg qhs  Take these medicines before exercise if your asthma is exercise-induced  Medicine How much to take When to take it  albuterol (PROVENTIL,VENTOLIN) 2 puffs with a spacer 20 minutes before exercise   YELLOW ZONE: Asthma is Getting Worse  Cough, wheeze, chest tightness or shortness of breath or Waking at night due to asthma, or Can do some, but not all, usual activities  Take quick-relief medicine - and keep taking your GREEN ZONE medicines  Take the albuterol (PROVENTIL,VENTOLIN) inhaler 2 puffs every 20 minutes for up to 1 hour with a spacer.   If your symptoms do not improve after 1 hour of above treatment, or if the albuterol (PROVENTIL,VENTOLIN) is not lasting 4 hours between treatments: Call your doctor to be seen    RED ZONE: Medical Alert!  Very short of breath, or Quick relief medications have not helped, or Cannot do usual activities, or Symptoms are same or worse after 24 hours in the Yellow Zone  First, take these medicines:  Take the albuterol (PROVENTIL,VENTOLIN) inhaler 2 puffs every 20 minutes for up to 1 hour with a spacer.  Then call your medical provider NOW! Go to the hospital or call an ambulance if: You are still in the Red Zone after 15 minutes, AND You have not reached your medical provider DANGER SIGNS  Trouble walking and talking due to shortness of breath, or Lips or fingernails are blue Take 4 puffs of your quick relief medicine with a spacer, AND Go to the hospital or  call for an ambulance (call 911) NOW!

## 2014-05-08 ENCOUNTER — Ambulatory Visit: Payer: No Typology Code available for payment source | Admitting: Rehabilitation

## 2014-05-08 DIAGNOSIS — S8392XA Sprain of unspecified site of left knee, initial encounter: Secondary | ICD-10-CM

## 2014-05-08 NOTE — Therapy (Signed)
[place], [place], [address] Phone: [phone]   Fax:  [phone]  Physical Therapy Treatment  Patient Details  Name: Stacey Moyer MRN: [id number] Date of Birth: [birth year] Referring Provider:  Ok Edwards, MD  Encounter Date: 05/08/2014    Past Medical History  Diagnosis Date  . Asthma   . Depression   . Anxiety     Past Surgical History  Procedure Laterality Date  . Tooth extraction      There were no vitals taken for this visit.  Visit Diagnosis:  Knee sprain and strain, left, initial encounter      Subjective Assessment - 05/08/14 1206    Symptoms Just a little sore on my knee cap.   Currently in Pain? No/denies                    [facility] Adult PT Treatment/Exercise - 05/08/14 0001    Knee/Hip Exercises: Aerobic   Stationary Bike L3 5 min pace 40 RPM   Knee/Hip Exercises: Standing   Forward Lunges Right;Left;1 set;10 reps  tactile and verbal cues needed  an done at counter   Lateral Step Up 10 reps;15 reps;Hand Hold: 1;Step Height: 6"   Forward Step Up Left;20 reps;Step Height: 6"   Step Down 1 set;10 reps;Hand Hold: 1;Step Height: 4"  unable to perform without compensation   Rebounder with SLS on and off foam   Knee/Hip Exercises: Supine   Other Supine Knee Exercises reformer see therapty note      Pilates Reformer used for LE/core strength, postural strength, lumbopelvic disassociation and core control. Exercises included: Footwork: parallel and V on toes and heels Bridging 3 springs parallel and wide Jumping on spring board 1 red 1 yellow red with cues to increase spring and land on left foot single and bilateral jumping with increased confidence and mild intermittent pain.  All exercises, pt required cues for continued core engagement and neutral spine.              PT Short Term Goals - 04/12/14 1011    PT SHORT TERM GOAL #1   Title Pt. will be I  with initial HEP for Lt. knee   Status Achieved   PT SHORT TERM GOAL #2   Title Walk without crutches and min pain increase   Status Achieved   PT SHORT TERM GOAL #3   Title Pt. will achieve full PROM    Status Achieved   PT SHORT TERM GOAL #4   Title Pt. will demo 4/5 quad strength to ease gait, mobility on campus   Status Achieved           PT Long Term Goals - 04/17/14 1223    PT LONG TERM GOAL #1   Title Pt. wil be I with advanced HEP for LE. LE   Status Achieved   PT LONG TERM GOAL #2   Title Pt. will use and understanding RICE to prevent further injury   Status Achieved   PT LONG TERM GOAL #3   Title Walk without limp or device as needed in the community and min occ pain   Status Achieved   PT LONG TERM GOAL #4   Title Return to light activitiy, dance with min occasional pain as guided by MD   Status Achieved   PT LONG TERM GOAL #5   Title Quad strength 5/5  4+/5   Status Not Met   PT LONG TERM GOAL #6  Title Stairs without difficulty   Time 2   Status On-going   PT LONG TERM GOAL #7   Title return to running with min 91/10)pain with brace   Status On-going   PT LONG TERM GOAL #8   Title jump with 1/10 pain in knee with brace   Status On-going               Plan - 05/08/14 1209    Clinical Impression Statement Improved confidence with jumpin on reformer. Continued ecentric quad and calf weakness left. Small bilateral hopping in place, she was able to land with equal weight both feet.    PT Next Visit Plan jumping on reformer FOTO NEXT VISIT, eccentric quad strength, try hopping in place, mini tramp        Problem List Patient Active Problem List   Diagnosis Date Noted  . Weight loss 05/03/2014  . Postinflammatory hypopigmentation 02/13/2014  . Dysmenorrhea 02/13/2014  . Patellar subluxation 01/15/2014  . Presence of subdermal contraceptive implant 02/15/2013  . Allergic rhinitis 09/19/2012  . Mild persistent asthma 09/19/2012    Dorene Ar, PTA 05/08/2014, 12:15 PM  [facility] [address] [place], [place], [address] Phone: [phone]   Fax:  [phone]

## 2014-05-11 ENCOUNTER — Ambulatory Visit: Payer: No Typology Code available for payment source | Admitting: Physical Therapy

## 2014-05-14 ENCOUNTER — Ambulatory Visit: Payer: No Typology Code available for payment source | Admitting: Physical Therapy

## 2014-05-14 DIAGNOSIS — S8392XA Sprain of unspecified site of left knee, initial encounter: Secondary | ICD-10-CM

## 2014-05-14 NOTE — Therapy (Signed)
[place], [place], [address] Phone: [phone]   Fax:  [phone]  Physical Therapy Treatment/Renewal  Patient Details  Name: Stacey Moyer MRN: [id number] Date of Birth: [birth year] Referring Provider:  Ok Edwards, MD  Encounter Date: 05/14/2014      PT End of Session - 05/14/14 0939    Visit Number 13   Number of Visits 20   Date for PT Re-Evaluation 06/11/14   PT Start Time 0939   PT Stop Time 1020   PT Time Calculation (min) 41 min   Activity Tolerance Patient tolerated treatment well      Past Medical History  Diagnosis Date  . Asthma   . Depression   . Anxiety     Past Surgical History  Procedure Laterality Date  . Tooth extraction      There were no vitals taken for this visit.  Visit Diagnosis:  Knee sprain and strain, left, initial encounter      Subjective Assessment - 05/14/14 0945    Symptoms No pain, just woke up.  Pain increases after those step downs          [facility] PT Assessment - 05/14/14 1021    Strength   Right/Left Hip --  glute med 3+/5 Lt LE, Rt. 4-/5   Right Hip ABduction 4+/5   Left Hip ABduction 4/5       Pilates Reformer used for LE/core strength, postural strength, lumbopelvic disassociation and core control.  Exercises included: Footwork 2 Red and 1 Blue single and double leg press worked in single leg 2 Red eccentric lowering Bridging 2 Red and 1 Blue with press out x 10 Jumpboard 1 Red double leg and single leg- unable to maintain good LE alignment and core control.  Attempted in sidelying with L LE, needs cues to maintain stable pelvis      [facility] Adult PT Treatment/Exercise - 05/14/14 1405    Knee/Hip Exercises: Standing   Step Down Both;2 sets;10 reps;Hand Hold: 1   Step Down Limitations mirror for cues   SLS hip abd x 10 each   Other Standing Knee Exercises hip hike for LLE/glute          PT Education - 05/14/14 1022    Education  provided Yes   Education Details pelvic stability and glute med, LE alignment, cont with HEP   Person(s) Educated Patient   Methods Explanation;Demonstration   Comprehension Verbalized understanding;Returned demonstration          PT Short Term Goals - 04/12/14 1011    PT SHORT TERM GOAL #1   Title Pt. will be I with initial HEP for Lt. knee   Status Achieved   PT SHORT TERM GOAL #2   Title Walk without crutches and min pain increase   Status Achieved   PT SHORT TERM GOAL #3   Title Pt. will achieve full PROM    Status Achieved   PT SHORT TERM GOAL #4   Title Pt. will demo 4/5 quad strength to ease gait, mobility on campus   Status Achieved           PT Long Term Goals - 05/14/14 0954    PT LONG TERM GOAL #1   Title Pt. wil be I with advanced HEP for LE. LE   Status On-going   PT LONG TERM GOAL #2   Title Pt. will use and understanding RICE to prevent further injury   Status Achieved   PT  LONG TERM GOAL #3   Title Walk without limp or device as needed in the community and min occ pain   Status Achieved   PT LONG TERM GOAL #4   Title Return to light activitiy, dance with min occasional pain as guided by MD   Status Achieved   PT LONG TERM GOAL #5   Title Quad strength 5/5   Status On-going   PT LONG TERM GOAL #6   Title Stairs without difficulty   Baseline has to go one at a time   Status On-going   PT LONG TERM GOAL #7   Title return to jogging with min (1/10) pain with brace   Status On-going   PT LONG TERM GOAL #8   Title jump with 1/10 pain in knee with brace   Status On-going               Plan - 05/14/14 0959    Clinical Impression Statement Patient does not have good enough knee control to jump without brace or on Refomer repeatedly. She lacks core, hip and eccentric knee strength.  Pain with jogging 4.0 mph.  Will renew for 4 more weeks to continue to monitor increasing levels of activity.     Pt will benefit from skilled therapeutic  intervention in order to improve on the following deficits Abnormal gait;Decreased range of motion;Difficulty walking;Impaired flexibility;Decreased activity tolerance;Decreased knowledge of use of DME;Decreased strength;Decreased mobility   Rehab Potential Excellent   PT Frequency 1x / week   PT Duration 4 weeks   PT Treatment/Interventions ADLs/Self Care Home Management;Moist Heat;Patient/family education;Passive range of motion;Therapeutic exercise;DME Instruction;Ultrasound;Gait training;Manual techniques;Neuromuscular re-education;Stair training;Cryotherapy;Electrical Stimulation;Functional mobility training   PT Next Visit Plan emailed FOTO, cont with POC   PT Home Exercise Plan side leg lifts (stand/sidelying) and reformer   Consulted and Agree with Plan of Care Patient        Problem List Patient Active Problem List   Diagnosis Date Noted  . Weight loss 05/03/2014  . Postinflammatory hypopigmentation 02/13/2014  . Dysmenorrhea 02/13/2014  . Patellar subluxation 01/15/2014  . Presence of subdermal contraceptive implant 02/15/2013  . Allergic rhinitis 09/19/2012  . Mild persistent asthma 09/19/2012    PAA,JENNIFER 05/14/2014, 2:07 PM  [facility] [address] [place], [place], [address] Phone: [phone]   Fax:  [phone]

## 2014-05-15 ENCOUNTER — Encounter: Payer: Self-pay | Admitting: Pediatrics

## 2014-05-15 ENCOUNTER — Ambulatory Visit: Payer: Self-pay | Admitting: Pediatrics

## 2014-05-15 NOTE — Progress Notes (Signed)
Pre-Visit Planning  Review of previous notes:  Last seen in [facility] on 02/13/2014.  Treatment plan at last visit included trial of naproxen for dysmenorrhea and BTB, continue nexplanon.  Also seen 02/13/2014 by [facility] to referral for counseling.  Followed in outpatient rehab for PT for knee strain.  Seen in ED 02/24/2014 for RLQ pain/periumbilcal pain.  CT neg for appendicitis, diagnosed with UTI. Seen by Dr. Jess Barters 03/28/14 for asthma exacerbation.  Seen by Dr. Derrell Lolling 05/03/14 for asthma f/u.  Weight loss noted at that visit as well.   Previous Psych Screenings?  n/a Psych Screenings Due? n/a  STI screen in the past year? yes Pertinent Labs? no  To Do at visit:   - check hgb if BTB - Confirm weight has stabilized - Confirm counseling referral completed

## 2014-05-17 ENCOUNTER — Ambulatory Visit: Payer: No Typology Code available for payment source | Attending: Family Medicine | Admitting: Rehabilitation

## 2014-05-17 DIAGNOSIS — Z5189 Encounter for other specified aftercare: Secondary | ICD-10-CM | POA: Insufficient documentation

## 2014-05-17 DIAGNOSIS — S8392XA Sprain of unspecified site of left knee, initial encounter: Secondary | ICD-10-CM

## 2014-05-17 DIAGNOSIS — S838X2A Sprain of other specified parts of left knee, initial encounter: Secondary | ICD-10-CM | POA: Insufficient documentation

## 2014-05-17 DIAGNOSIS — S83003D Unspecified subluxation of unspecified patella, subsequent encounter: Secondary | ICD-10-CM | POA: Insufficient documentation

## 2014-05-17 DIAGNOSIS — F419 Anxiety disorder, unspecified: Secondary | ICD-10-CM | POA: Insufficient documentation

## 2014-05-17 DIAGNOSIS — F329 Major depressive disorder, single episode, unspecified: Secondary | ICD-10-CM | POA: Insufficient documentation

## 2014-05-17 DIAGNOSIS — J45909 Unspecified asthma, uncomplicated: Secondary | ICD-10-CM | POA: Insufficient documentation

## 2014-05-17 NOTE — Therapy (Signed)
[place], [place], [address] Phone: [phone]   Fax:  [phone]  Physical Therapy Treatment  Patient Details  Name: Stacey Moyer MRN: [id number] Date of Birth: [birth year] Referring Provider:  Ok Edwards, MD  Encounter Date: 05/17/2014      PT End of Session - 05/17/14 0959    Visit Number 14   Number of Visits 20   Date for PT Re-Evaluation 06/11/14   PT Start Time 0932      Past Medical History  Diagnosis Date  . Asthma   . Depression   . Anxiety     Past Surgical History  Procedure Laterality Date  . Tooth extraction      There were no vitals taken for this visit.  Visit Diagnosis:  Knee sprain and strain, left, initial encounter      Subjective Assessment - 05/17/14 0937    Symptoms Hurting after dance practice yesterday but I did not do anything different. 2/10 pain now, feels like soreness          [facility] PT Assessment - 05/17/14 1012    Observation/Other Assessments   Focus on Therapeutic Outcomes (FOTO)  38% limited improved from 76% limited, 45 % predicated                  [facility] Adult PT Treatment/Exercise - 05/17/14 1000    Knee/Hip Exercises: Aerobic   Tread Mill 3.0 mph x walk 2 min, jog @ 3.65mph x 2 min, performed 2 rounds and then 1 min cool down for a total of 7 minutes  with KT tapr for knee support   Knee/Hip Exercises: Plyometrics   Other Plyometric Exercises light jogging 40 ft x 4 with pt c/o knee pain, applied KT tape for Patella strapping and knee support with pt reporting significant decrease in pain with jogging.    Knee/Hip Exercises: Standing   Lateral Step Up 10 reps;15 reps;Hand Hold: 1;Step Height: 6"   Forward Step Up Left;20 reps;Step Height: 6"   Step Down Both;2 sets;10 reps;Hand Hold: 1   Rebounder with SLS on and off foam  3 ways x 20 tosses   Cryotherapy   Number Minutes Cryotherapy 15 Minutes   Cryotherapy Location Knee  Lt    Type of Cryotherapy Ice pack   Manual Therapy   Manual Therapy Other (comment)   Other Manual Therapy KT tape for patella tendon and total knee supprt. Pt reports decreased pain and able to perform jogging activities without pain after application                  PT Short Term Goals - 04/12/14 1011    PT SHORT TERM GOAL #1   Title Pt. will be I with initial HEP for Lt. knee   Status Achieved   PT SHORT TERM GOAL #2   Title Walk without crutches and min pain increase   Status Achieved   PT SHORT TERM GOAL #3   Title Pt. will achieve full PROM    Status Achieved   PT SHORT TERM GOAL #4   Title Pt. will demo 4/5 quad strength to ease gait, mobility on campus   Status Achieved           PT Long Term Goals - 05/14/14 0954    PT LONG TERM GOAL #1   Title Pt. wil be I with advanced HEP for LE. LE   Status On-going   PT LONG TERM GOAL #  2   Title Pt. will use and understanding RICE to prevent further injury   Status Achieved   PT LONG TERM GOAL #3   Title Walk without limp or device as needed in the community and min occ pain   Status Achieved   PT LONG TERM GOAL #4   Title Return to light activitiy, dance with min occasional pain as guided by MD   Status Achieved   PT LONG TERM GOAL #5   Title Quad strength 5/5   Status On-going   PT LONG TERM GOAL #6   Title Stairs without difficulty   Baseline has to go one at a time   Status On-going   PT LONG TERM GOAL #7   Title return to jogging with min (1/10) pain with brace   Status On-going   PT LONG TERM GOAL #8   Title jump with 1/10 pain in knee with brace   Status On-going               Plan - 05/17/14 1003    Clinical Impression Statement Significant decrease in pain with KT tape applied. Pt to purchase and bring next visit.    PT Next Visit Plan continue jogging/plyometricsfor return to dance activities using KT tape        Problem List Patient Active Problem List   Diagnosis Date Noted  .  Weight loss 05/03/2014  . Postinflammatory hypopigmentation 02/13/2014  . Dysmenorrhea 02/13/2014  . Patellar subluxation 01/15/2014  . Presence of subdermal contraceptive implant 02/15/2013  . Allergic rhinitis 09/19/2012  . Mild persistent asthma 09/19/2012    Dorene Ar, PTA 05/17/2014, 5:14 PM  [facility] [address] [place], [place], [address] Phone: [phone]   Fax:  [phone]

## 2014-05-22 ENCOUNTER — Ambulatory Visit: Payer: No Typology Code available for payment source | Admitting: Physical Therapy

## 2014-05-29 ENCOUNTER — Ambulatory Visit: Payer: No Typology Code available for payment source | Admitting: Physical Therapy

## 2014-05-29 DIAGNOSIS — S8392XA Sprain of unspecified site of left knee, initial encounter: Secondary | ICD-10-CM

## 2014-05-29 NOTE — Patient Instructions (Signed)
Hamstring and ITB stretch given

## 2014-05-29 NOTE — Therapy (Signed)
[place], [place], [address] Phone: [phone]   Fax:  [phone]  Physical Therapy Treatment  Patient Details  Name: Stacey Moyer MRN: [id number] Date of Birth: [birth year] Referring Provider:  Ok Edwards, MD  Encounter Date: 05/29/2014      PT End of Session - 05/29/14 0834    Visit Number 15   Number of Visits 20   Date for PT Re-Evaluation 06/11/14   PT Start Time 0800   PT Stop Time 5176   PT Time Calculation (min) 55 min      Past Medical History  Diagnosis Date  . Asthma   . Depression   . Anxiety     Past Surgical History  Procedure Laterality Date  . Tooth extraction      There were no vitals filed for this visit.  Visit Diagnosis:  No diagnosis found.      Subjective Assessment - 05/29/14 0756    Symptoms She has pain today, tired.  Dance class last night.    Currently in Pain? Yes   Pain Score 2    Pain Location Knee   Pain Orientation Anterior;Left   Pain Type Chronic pain   Pain Onset More than a month ago   Pain Frequency Intermittent   Multiple Pain Sites No            [facility] PT Assessment - 05/29/14 0820    Strength   Right Hip ABduction 4/5   Left Hip ABduction 4+/5   Right Knee Flexion 5/5   Right Knee Extension 4+/5   Left Knee Flexion 5/5   Left Knee Extension 3+/5  pain          [facility] Adult PT Treatment/Exercise - 05/29/14 0826    Knee/Hip Exercises: Stretches   Active Hamstring Stretch 3 reps;30 seconds   ITB Stretch 3 reps;30 seconds   Knee/Hip Exercises: Aerobic   Stationary Bike NuStep LEs only 6 min level 6   Knee/Hip Exercises: Machines for Strengthening   Cybex Knee Extension 1 plate x 10 cues for full ext.    Knee/Hip Exercises: Standing   Forward Step Up Left;1 set;20 reps;Hand Hold: 1   Step Down --   Lunge Walking - Round Trips 4   Other Standing Knee Exercises [facility] standing lunge   Other Standing Knee Exercises hip hike  (glute med x 10 each LE)   Cryotherapy   Number Minutes Cryotherapy 15 Minutes   Cryotherapy Location Knee   Type of Cryotherapy Ice pack   Manual Therapy   Manual Therapy Other (comment)   Other Manual Therapy KT tape                PT Education - 05/29/14 0845    Education provided Yes   Education Details added ITB stretch   Person(s) Educated Patient   Methods Explanation;Demonstration   Comprehension Verbalized understanding;Returned demonstration          PT Short Term Goals - 05/29/14 0831    PT SHORT TERM GOAL #1   Title Pt. will be I with initial HEP for Lt. knee   Status Achieved   PT SHORT TERM GOAL #2   Title Walk without crutches and min pain increase   PT SHORT TERM GOAL #3   Title Pt. will achieve full PROM    PT SHORT TERM GOAL #4   Title Pt. will demo 4/5 quad strength to ease gait, mobility on campus   Status  Achieved           PT Long Term Goals - 05/29/14 0831    PT LONG TERM GOAL #1   Title Pt. wil be I with advanced HEP for LE. LE   Status On-going   PT LONG TERM GOAL #2   Title Pt. will use and understanding RICE to prevent further injury   Status Achieved   PT LONG TERM GOAL #3   Title Walk without limp or device as needed in the community and min occ pain   Status Achieved   PT LONG TERM GOAL #4   Title Return to light activitiy, dance with min occasional pain as guided by MD   Status Achieved   PT LONG TERM GOAL #5   Title Quad strength 5/5   Status On-going   PT LONG TERM GOAL #6   Title Stairs without difficulty   Status On-going   PT LONG TERM GOAL #7   Title return to jogging with min (1/10) pain with brace   Status On-going   PT LONG TERM GOAL #8   Title jump with 1/10 pain in knee with brace   Status On-going               Problem List Patient Active Problem List   Diagnosis Date Noted  . Weight loss 05/03/2014  . Postinflammatory hypopigmentation 02/13/2014  . Dysmenorrhea 02/13/2014  . Patellar  subluxation 01/15/2014  . Presence of subdermal contraceptive implant 02/15/2013  . Allergic rhinitis 09/19/2012  . Mild persistent asthma 09/19/2012    Stacey Moyer 05/29/2014, 8:49 AM  [facility] [address] [place], [place], [address] Phone: [phone]   Fax:  [phone]

## 2014-06-01 ENCOUNTER — Telehealth: Payer: Self-pay

## 2014-06-01 NOTE — Telephone Encounter (Signed)
Per Barbie pt needs to fill out [facility] form.

## 2014-06-04 ENCOUNTER — Ambulatory Visit: Payer: Self-pay | Admitting: Pediatrics

## 2014-06-05 ENCOUNTER — Ambulatory Visit: Payer: No Typology Code available for payment source | Admitting: Physical Therapy

## 2014-06-07 ENCOUNTER — Ambulatory Visit: Payer: No Typology Code available for payment source | Admitting: Physical Therapy

## 2014-06-07 DIAGNOSIS — S8392XA Sprain of unspecified site of left knee, initial encounter: Secondary | ICD-10-CM

## 2014-06-07 NOTE — Therapy (Signed)
[place], [place], [address] Phone: [phone]   Fax:  [phone]  Physical Therapy Treatment  Patient Details  Name: Stacey Moyer MRN: [id number] Date of Birth: 03/11/1996 Referring Provider:  Ok Edwards, MD  Encounter Date: 06/07/2014      PT End of Session - 06/07/14 0904    Visit Number 16   Number of Visits 20   Date for PT Re-Evaluation 06/11/14   PT Start Time 0859   PT Stop Time 0930   PT Time Calculation (min) 31 min      Past Medical History  Diagnosis Date  . Asthma   . Depression   . Anxiety     Past Surgical History  Procedure Laterality Date  . Tooth extraction      There were no vitals filed for this visit.  Visit Diagnosis:  Knee sprain and strain, left, initial encounter      Subjective Assessment - 06/07/14 0902    Symptoms I have not danced in a week. I did jog on treadmill for 10 minutes without increased pain but I had tape on.    Currently in Pain? No/denies                       [facility] Adult PT Treatment/Exercise - 06/07/14 5852    Ambulation/Gait   Stairs Yes   Stairs Assistance 7: Independent   Stair Management Technique No rails;Alternating pattern   Number of Stairs 18   Height of Stairs 6   Knee/Hip Exercises: Standing   Forward Step Up Left;1 set;20 reps;Hand Hold: 0   Step Down Both;2 sets;10 reps;Hand Hold: 1   Step Down Limitations increased pain left knee   Other Standing Knee Exercises standing single leg knee bends x10 with mirror feedback.    increased pain    Cryotherapy   Number Minutes Cryotherapy 15 Minutes   Cryotherapy Location Knee   Type of Cryotherapy Ice pack   Manual Therapy   Manual Therapy Other (comment)   Other Manual Therapy KT tape  for left knee support pt emailed link for application                  PT Short Term Goals - 06/07/14 0905    PT SHORT TERM GOAL #1   Title Pt. will be I with  initial HEP for Lt. knee   Baseline 4   Period Weeks   Status Achieved   PT SHORT TERM GOAL #2   Title Walk without crutches and min pain increase   Baseline 4   Period Weeks   Status Achieved   PT SHORT TERM GOAL #3   Title Pt. will achieve full PROM    Time 4   Status Achieved   PT SHORT TERM GOAL #4   Title Pt. will demo 4/5 quad strength to ease gait, mobility on campus   Time 4   Period Weeks   Status Achieved           PT Long Term Goals - 06/07/14 7782    PT LONG TERM GOAL #1   Title Pt. wil be I with advanced HEP for LE. LE   Time 8   Period Weeks   Status On-going   PT LONG TERM GOAL #2   Title Pt. will use and understanding RICE to prevent further injury   Time 8   Period Weeks   Status Achieved   PT  LONG TERM GOAL #3   Title Walk without limp or device as needed in the community and min occ pain   Time 8   Period Weeks   Status Achieved   PT LONG TERM GOAL #4   Title Return to light activitiy, dance with min occasional pain as guided by MD   Time 8   Period Weeks   Status Achieved   PT LONG TERM GOAL #5   Title Quad strength 5/5   Time 8   Period Weeks   Status On-going   PT LONG TERM GOAL #6   Title Stairs without difficulty   Time 2   PT LONG TERM GOAL #7   Title return to jogging with min (1/10) pain with brace   Time 4   Period Weeks   Status Achieved               Plan - 06/07/14 0934    Clinical Impression Statement Pt reports she missed her last appointment due to car break down. She arrived 15 minutes late for appt today which significantly decreased treatment time. Pt instructed in self application of KT tape for running and recitals. Pt exhibits continued poor motor control with eccentric quads and also increased patella tendon pain and medial joint line pain with step down exercises. Most LTGs met except knee strength goal.    PT Next Visit Plan Review pt's quad strengthening exercise and streamline for continued Home quad  strengthening. Probable DC         Problem List Patient Active Problem List   Diagnosis Date Noted  . Weight loss 05/03/2014  . Postinflammatory hypopigmentation 02/13/2014  . Dysmenorrhea 02/13/2014  . Patellar subluxation 01/15/2014  . Presence of subdermal contraceptive implant 02/15/2013  . Allergic rhinitis 09/19/2012  . Mild persistent asthma 09/19/2012    Dorene Ar, PTA 06/07/2014, 11:35 AM  [facility] [address] [place], [place], [address] Phone: [phone]   Fax:  [phone]

## 2014-06-12 ENCOUNTER — Encounter: Payer: Self-pay | Admitting: Physical Therapy

## 2014-06-12 ENCOUNTER — Ambulatory Visit: Payer: Self-pay | Admitting: Physical Therapy

## 2014-06-12 DIAGNOSIS — S8392XA Sprain of unspecified site of left knee, initial encounter: Secondary | ICD-10-CM

## 2014-06-12 NOTE — Therapy (Signed)
[place], [place], [address] Phone: [phone]   Fax:  [phone]  Physical Therapy Treatment  Patient Details  Name: Stacey Moyer MRN: [id number] Date of Birth: 12-04-1995 Referring Provider:  Ok Edwards, MD  Encounter Date: 06/12/2014      PT End of Session - 06/12/14 0835    Visit Number 17   Number of Visits 20   PT Start Time 0802   PT Stop Time 0845   PT Time Calculation (min) 43 min   Activity Tolerance Patient tolerated treatment well      Past Medical History  Diagnosis Date  . Asthma   . Depression   . Anxiety     Past Surgical History  Procedure Laterality Date  . Tooth extraction      There were no vitals filed for this visit.  Visit Diagnosis:  Knee sprain and strain, left, initial encounter      Subjective Assessment - 06/12/14 0811    Symptoms I notice more pain/soreness when I'm on my feet all day. No pain today.  I dont dance without the brace.    Currently in Pain? No/denies  Was 4/10 last night.             [facility] PT Assessment - 06/12/14 0829    Strength   Right Hip ABduction 4/5   Left Hip ABduction 4+/5   Right Knee Flexion 5/5   Right Knee Extension 5/5   Left Knee Flexion 5/5   Left Knee Extension 4+/5                   [facility] Adult PT Treatment/Exercise - 06/12/14 0813    Knee/Hip Exercises: Aerobic   Elliptical 8 min level 1 , ramp 8   Knee/Hip Exercises: Standing   Forward Step Up Both;1 set;Hand Hold: 0;Step Height: 4";Limitations   Forward Step Up Limitations fast pace 1 min    Functional Squat 2 sets;10 reps;Other (comment)   Functional Squat Limitations heel on step   SLS hip abd x 20 each    Knee/Hip Exercises: Supine   Straight Leg Raises Strengthening;Both;1 set;10 reps   Straight Leg Raise with External Rotation Strengthening;Both;1 set;10 reps   Knee/Hip Exercises: Sidelying   Hip ABduction Strengthening;Both;1 set;10  reps   Hip ADduction Strengthening;Both;1 set;10 reps   Other Sidelying Knee Exercises quaduped hip ext and knee flexion x 10 each    Knee/Hip Exercises: Prone   Other Prone Exercises hip ext x 10 each                 PT Education - 06/12/14 0900    Education provided Yes   Education Details full HEP, options for continuing strengthening, Pilates   Person(s) Educated Patient   Methods Explanation;Handout   Comprehension Verbalized understanding          PT Short Term Goals - 06/12/14 0903    PT SHORT TERM GOAL #1   Title Pt. will be I with initial HEP for Lt. knee   Status Achieved   PT SHORT TERM GOAL #2   Title Walk without crutches and min pain increase   Status Achieved   PT SHORT TERM GOAL #3   Title Pt. will achieve full PROM    Status Achieved   PT SHORT TERM GOAL #4   Title Pt. will demo 4/5 quad strength to ease gait, mobility on campus   Status Achieved  PT Long Term Goals - 06/12/14 0904    PT LONG TERM GOAL #1   Title Pt. wil be I with advanced HEP for LE. LE   Status Achieved   PT LONG TERM GOAL #2   Title Pt. will use and understanding RICE to prevent further injury   Status Achieved   PT LONG TERM GOAL #3   Title Walk without limp or device as needed in the community and min occ pain   Status Achieved   PT LONG TERM GOAL #4   Title Return to light activitiy, dance with min occasional pain as guided by MD   Status Achieved   PT LONG TERM GOAL #5   Title Quad strength 5/5   Baseline 4+/5   Status Not Met   PT LONG TERM GOAL #6   Title Stairs without difficulty   Baseline goes slowly   Status Partially Met   PT LONG TERM GOAL #7   Title return to jogging with min (1/10) pain with brace   Status Partially Met   PT LONG TERM GOAL #8   Title jump with 1/10 pain in knee with brace   Status Partially Met               Plan - 06/12/14 0901    Clinical Impression Statement Patient continues to have weakness in quad (L)  and core, hips.  She is DC due to a plateau of progress, pt agreeable. FOTO 35% limited (initially 76%).          Problem List Patient Active Problem List   Diagnosis Date Noted  . Weight loss 05/03/2014  . Postinflammatory hypopigmentation 02/13/2014  . Dysmenorrhea 02/13/2014  . Patellar subluxation 01/15/2014  . Presence of subdermal contraceptive implant 02/15/2013  . Allergic rhinitis 09/19/2012  . Mild persistent asthma 09/19/2012    Klee Kolek 06/12/2014, 9:13 AM  [facility] [address] [place], [place], [address] Phone: [phone]   Fax:  [phone]    PHYSICAL THERAPY DISCHARGE SUMMARY  Visits from Start of Care: 17  Current functional level related to goals / functional outcomes: See above for goals met: Has returned to dancing, fitness with brace.  Notices mild pain with overactivity   Remaining deficits: Weakness in core, L quad and hips   Education / Equipment: HEP, RICE, physical activity Plan: Patient agrees to discharge.  Patient goals were met. Patient is being discharged due to being pleased with the current functional level.  ?????    Raeford Razor, PT 06/12/2014 9:16 AM Phone: [phone] Fax: [phone]

## 2014-06-12 NOTE — Patient Instructions (Signed)
v

## 2014-06-27 ENCOUNTER — Emergency Department (INDEPENDENT_AMBULATORY_CARE_PROVIDER_SITE_OTHER): Payer: Worker's Compensation

## 2014-06-27 ENCOUNTER — Emergency Department (INDEPENDENT_AMBULATORY_CARE_PROVIDER_SITE_OTHER)
Admission: EM | Admit: 2014-06-27 | Discharge: 2014-06-27 | Disposition: A | Discharge status: Home / Self Care | Payer: Worker's Compensation | Source: Home / Self Care | Attending: Emergency Medicine | Admitting: Emergency Medicine

## 2014-06-27 ENCOUNTER — Encounter (HOSPITAL_COMMUNITY): Payer: Self-pay | Admitting: Emergency Medicine

## 2014-06-27 DIAGNOSIS — S20211A Contusion of right front wall of thorax, initial encounter: Secondary | ICD-10-CM

## 2014-06-27 MED ORDER — IBUPROFEN 600 MG PO TABS: 600.0000 mg | ORAL_TABLET | Freq: Four times a day (QID) | ORAL | Status: DC | PRN

## 2014-06-27 NOTE — Discharge Instructions (Signed)
You do not have any broken ribs. Apply ice to the sore areas as often as you can. Take ibuprofen 6 mg every 6 hours as needed for pain. This will take 2-3 weeks to fully heal. Follow-up as needed.

## 2014-06-27 NOTE — ED Provider Notes (Signed)
CSN: [id number]     Arrival date & time 06/27/14  1616 History   First MD Initiated Contact with Patient 06/27/14 1707     Chief Complaint  Patient presents with  . Fall   (Consider location/radiation/quality/duration/timing/severity/associated sxs/prior Treatment) HPI She is a 19 year old woman here for evaluation of rib pain after a fall. She states she was at work when she slipped on wet floor and fell into the corner of a counter. She hit her right ribs and her right arm. She states the arm is a little sore, but denies any pain with movement. She reports a small bruise on her right upper arm. Her right side is more painful. It is tender to palpation. She does have some discomfort with deep breaths. No difficulty breathing.  Past Medical History  Diagnosis Date  . Asthma   . Depression   . Anxiety    Past Surgical History  Procedure Laterality Date  . Tooth extraction     History reviewed. No pertinent family history. History  Substance Use Topics  . Smoking status: Never Smoker   . Smokeless tobacco: Never Used  . Alcohol Use: No   OB History    No data available     Review of Systems As in history of present illness Allergies  Penicillins  Home Medications   Prior to Admission medications   Medication Sig Start Date End Date Taking? Authorizing Provider  albuterol (PROVENTIL HFA;VENTOLIN HFA) 108 (90 BASE) MCG/ACT inhaler Inhale 2 puffs into the lungs every 6 (six) hours as needed. For shortness of breath 05/03/14  Yes Shruti Anderson [place], MD  beclomethasone (QVAR) 80 MCG/ACT inhaler Inhale 2 puffs into the lungs 2 (two) times daily. 05/03/14  Yes Shruti Anderson [place], MD  cetirizine (ZYRTEC) 10 MG tablet TAKE 1 TABLET BY MOUTH ONCE DAILY 07/01/13  Yes Shruti Anderson [place], MD  etonogestrel (NEXPLANON) 68 MG IMPL implant 1 each by Subdermal route once.   Yes Historical Provider, MD  montelukast (SINGULAIR) 10 MG tablet Take 1 tablet (10 mg total) by mouth daily. 02/13/14  Yes Gaspar Skeeters, MD  benzonatate (TESSALON PERLES) 100 MG capsule Take 1 capsule (100 mg total) by mouth 3 (three) times daily as needed for cough. 05/03/14   Shruti Anderson [place], MD  fexofenadine (ALLEGRA) 60 MG tablet Take 1 tablet (60 mg total) by mouth 2 (two) times daily. 05/03/14   Shruti Anderson [place], MD  ibuprofen (ADVIL,MOTRIN) 600 MG tablet Take 1 tablet (600 mg total) by mouth every 6 (six) hours as needed. 06/27/14   Melony Overly, MD   BP 121/79 mmHg  Pulse 61  Temp(Src) 98.5 F (36.9 C) (Oral)  Resp 16  SpO2 99%  LMP 06/26/2014 (Exact Date) Physical Exam  Constitutional: She is oriented to person, place, and time. She appears well-developed and well-nourished.  Cardiovascular: Normal rate.   Pulmonary/Chest: Effort normal.    Area of tenderness with some bruising and swelling.  Musculoskeletal:  Right arm has small bruise over distal bicep. She has full range of motion and strength in the right arm. She has minimal tenderness over biceps and volar forearm.  Neurological: She is alert and oriented to person, place, and time.    ED Course  Procedures (including critical care time) Labs Review Labs Reviewed - No data to display  Imaging Review Dg Ribs Unilateral W/chest Right  06/27/2014   CLINICAL DATA:  Initial encounter for slip and fall onto table today with right lateral rib pain.  EXAM: RIGHT RIBS AND CHEST - 3+ VIEW  COMPARISON:  Chest x-ray from 11/27/2012.  FINDINGS: The lungs are clear without focal consolidation, edema, effusion or pneumothorax. Cardio pericardial silhouette is within normal limits for size. Imaged bony structures of the thorax are intact.  Oblique views of the right ribs were obtained with a radiopaque BB localizing the area of patient concern. No underlying acute right rib fracture.  IMPRESSION: Negative.   Electronically Signed   By: Misty Stanley M.D.   On: 06/27/2014 18:05     MDM   1. Bruised rib, right, initial encounter    Symptomatic treatment with ice  and ibuprofen. Work note provided. Follow-up at occupational health as needed.    Melony Overly, MD 06/27/14 [phone]

## 2014-06-27 NOTE — ED Notes (Signed)
Pt slipped at work, hitting her right side/ribs on a table and injuring her right arm and shoulder.

## 2014-07-02 ENCOUNTER — Other Ambulatory Visit: Payer: Self-pay | Admitting: Pediatrics

## 2014-07-05 ENCOUNTER — Ambulatory Visit (HOSPITAL_COMMUNITY): Payer: Self-pay | Admitting: Psychiatry

## 2014-07-10 ENCOUNTER — Ambulatory Visit: Payer: Self-pay | Admitting: Pediatrics

## 2014-11-06 ENCOUNTER — Other Ambulatory Visit: Payer: Self-pay | Admitting: Pediatrics

## 2014-11-06 ENCOUNTER — Encounter: Payer: Self-pay | Admitting: Family

## 2014-11-06 ENCOUNTER — Ambulatory Visit (INDEPENDENT_AMBULATORY_CARE_PROVIDER_SITE_OTHER): Payer: Medicaid Other | Admitting: Pediatrics

## 2014-11-06 VITALS — BP 104/59 | HR 52 | Ht 62.5 in | Wt 145.2 lb

## 2014-11-06 DIAGNOSIS — R635 Abnormal weight gain: Secondary | ICD-10-CM | POA: Diagnosis not present

## 2014-11-06 DIAGNOSIS — B360 Pityriasis versicolor: Secondary | ICD-10-CM | POA: Diagnosis not present

## 2014-11-06 DIAGNOSIS — R300 Dysuria: Secondary | ICD-10-CM | POA: Diagnosis not present

## 2014-11-06 DIAGNOSIS — Z113 Encounter for screening for infections with a predominantly sexual mode of transmission: Secondary | ICD-10-CM

## 2014-11-06 DIAGNOSIS — B373 Candidiasis of vulva and vagina: Secondary | ICD-10-CM | POA: Diagnosis not present

## 2014-11-06 DIAGNOSIS — B3731 Acute candidiasis of vulva and vagina: Secondary | ICD-10-CM

## 2014-11-06 LAB — POCT URINALYSIS DIPSTICK
Bilirubin, UA: NEGATIVE
Blood, UA: NEGATIVE
Glucose, UA: NEGATIVE
KETONES UA: NEGATIVE
Nitrite, UA: NEGATIVE
PH UA: 7
PROTEIN UA: NEGATIVE
Spec Grav, UA: 1.01
Urobilinogen, UA: NEGATIVE

## 2014-11-06 MED ORDER — CLOTRIMAZOLE 1 % EX CREA: 1.0000 "application " | TOPICAL_CREAM | Freq: Two times a day (BID) | CUTANEOUS | Status: DC

## 2014-11-06 MED ORDER — FLUCONAZOLE 100 MG PO TABS
300.0000 mg | ORAL_TABLET | ORAL | Status: DC
Start: 2014-11-06 — End: 2015-04-25

## 2014-11-06 NOTE — Patient Instructions (Addendum)
Dr. Darron Doom is a GYN, [place] accepts adult patients, [phone]  My Chart User Name:  [email or web address]  Password:  [email or web address]  Monilial Vaginitis Vaginitis in a soreness, swelling and redness (inflammation) of the vagina and vulva. Monilial vaginitis is not a sexually transmitted infection. CAUSES  Yeast vaginitis is caused by yeast (candida) that is normally found in your vagina. With a yeast infection, the candida has overgrown in number to a point that upsets the chemical balance. SYMPTOMS   White, thick vaginal discharge.  Swelling, itching, redness and irritation of the vagina and possibly the lips of the vagina (vulva).  Burning or painful urination.  Painful intercourse. DIAGNOSIS  Things that may contribute to monilial vaginitis are:  Postmenopausal and virginal states.  Pregnancy.  Infections.  Being tired, sick or stressed, especially if you had monilial vaginitis in the past.  Diabetes. Good control will help lower the chance.  Birth control pills.  Tight fitting garments.  Using bubble bath, feminine sprays, douches or deodorant tampons.  Taking certain medications that kill germs (antibiotics).  Sporadic recurrence can occur if you become ill. TREATMENT  Your caregiver will give you medication.  There are several kinds of anti monilial vaginal creams and suppositories specific for monilial vaginitis. For recurrent yeast infections, use a suppository or cream in the vagina 2 times a week, or as directed.  Anti-monilial or steroid cream for the itching or irritation of the vulva may also be used. Get your caregiver's permission.  Painting the vagina with methylene blue solution may help if the monilial cream does not work.  Eating yogurt may help prevent monilial vaginitis. HOME CARE INSTRUCTIONS   Finish all medication as prescribed.  Do not have sex until treatment is completed or after your caregiver tells  you it is okay.  Take warm sitz baths.  Do not douche.  Do not use tampons, especially scented ones.  Wear cotton underwear.  Avoid tight pants and panty hose.  Tell your sexual partner that you have a yeast infection. They should go to their caregiver if they have symptoms such as mild rash or itching.  Your sexual partner should be treated as well if your infection is difficult to eliminate.  Practice safer sex. Use condoms.  Some vaginal medications cause latex condoms to fail. Vaginal medications that harm condoms are:  Cleocin cream.  Butoconazole (Femstat).  Terconazole (Terazol) vaginal suppository.  Miconazole (Monistat) (may be purchased over the counter). SEEK MEDICAL CARE IF:   You have a temperature by mouth above 102 F (38.9 C).  The infection is getting worse after 2 days of treatment.  The infection is not getting better after 3 days of treatment.  You develop blisters in or around your vagina.  You develop vaginal bleeding, and it is not your menstrual period.  You have pain when you urinate.  You develop intestinal problems.  You have pain with sexual intercourse. Document Released: 12/10/2004 Document Revised: 05/25/2011 Document Reviewed: 08/24/2008 [facility] Patient Information 2015 [place], [place]. This information is not intended to replace advice given to you by your health care provider. Make sure you discuss any questions you have with your health care provider.  High-Fiber Diet Fiber is found in fruits, vegetables, and grains. A high-fiber diet encourages the addition of more whole grains, legumes, fruits, and vegetables in your diet. The recommended amount of fiber for adult males is 38 g per day. For adult females, it is 25 g per day. Pregnant and  lactating women should get 28 g of fiber per day. If you have a digestive or bowel problem, ask your caregiver for advice before adding high-fiber foods to your diet. Eat a variety of  high-fiber foods instead of only a select few type of foods.  PURPOSE  To increase stool bulk.  To make bowel movements more regular to prevent constipation.  To lower cholesterol.  To prevent overeating. WHEN IS THIS DIET USED?  It may be used if you have constipation and hemorrhoids.  It may be used if you have uncomplicated diverticulosis (intestine condition) and irritable bowel syndrome.  It may be used if you need help with weight management.  It may be used if you want to add it to your diet as a protective measure against atherosclerosis, diabetes, and cancer. SOURCES OF FIBER  Whole-grain breads and cereals.  Fruits, such as apples, oranges, bananas, berries, prunes, and pears.  Vegetables, such as green peas, carrots, sweet potatoes, beets, broccoli, cabbage, spinach, and artichokes.  Legumes, such split peas, soy, lentils.  Almonds. FIBER CONTENT IN FOODS Starches and Grains / Dietary Fiber (g)  Cheerios, 1 cup / 3 g  Corn Flakes cereal, 1 cup / 0.7 g  Rice crispy treat cereal, 1 cup / 0.3 g  Instant oatmeal (cooked),  cup / 2 g  Frosted wheat cereal, 1 cup / 5.1 g  Brown, long-grain rice (cooked), 1 cup / 3.5 g  White, long-grain rice (cooked), 1 cup / 0.6 g  Enriched macaroni (cooked), 1 cup / 2.5 g Legumes / Dietary Fiber (g)  Baked beans (canned, plain, or vegetarian),  cup / 5.2 g  Kidney beans (canned),  cup / 6.8 g  Pinto beans (cooked),  cup / 5.5 g Breads and Crackers / Dietary Fiber (g)  Plain or honey graham crackers, 2 squares / 0.7 g  Saltine crackers, 3 squares / 0.3 g  Plain, salted pretzels, 10 pieces / 1.8 g  Whole-wheat bread, 1 slice / 1.9 g  White bread, 1 slice / 0.7 g  Raisin bread, 1 slice / 1.2 g  Plain bagel, 3 oz / 2 g  Flour tortilla, 1 oz / 0.9 g  Corn tortilla, 1 small / 1.5 g  Hamburger or hotdog bun, 1 small / 0.9 g Fruits / Dietary Fiber (g)  Apple with skin, 1 medium / 4.4 g  Sweetened  applesauce,  cup / 1.5 g  Banana,  medium / 1.5 g  Grapes, 10 grapes / 0.4 g  Orange, 1 small / 2.3 g  Raisin, 1.5 oz / 1.6 g  Melon, 1 cup / 1.4 g Vegetables / Dietary Fiber (g)  Green beans (canned),  cup / 1.3 g  Carrots (cooked),  cup / 2.3 g  Broccoli (cooked),  cup / 2.8 g  Peas (cooked),  cup / 4.4 g  Mashed potatoes,  cup / 1.6 g  Lettuce, 1 cup / 0.5 g  Corn (canned),  cup / 1.6 g  Tomato,  cup / 1.1 g Document Released: 03/02/2005 Document Revised: 09/01/2011 Document Reviewed: 06/04/2011 [employer] Patient Information 2015 [place], [place]. This information is not intended to replace advice given to you by your health care provider. Make sure you discuss any questions you have with your health care provider. High-Fiber Diet Fiber is found in fruits, vegetables, and grains. A high-fiber diet encourages the addition of more whole grains, legumes, fruits, and vegetables in your diet. The recommended amount of fiber for adult males is 70  g per day. For adult females, it is 25 g per day. Pregnant and lactating women should get 28 g of fiber per day. If you have a digestive or bowel problem, ask your caregiver for advice before adding high-fiber foods to your diet. Eat a variety of high-fiber foods instead of only a select few type of foods.  PURPOSE  To increase stool bulk.  To make bowel movements more regular to prevent constipation.  To lower cholesterol.  To prevent overeating. WHEN IS THIS DIET USED?  It may be used if you have constipation and hemorrhoids.  It may be used if you have uncomplicated diverticulosis (intestine condition) and irritable bowel syndrome.  It may be used if you need help with weight management.  It may be used if you want to add it to your diet as a protective measure against atherosclerosis, diabetes, and cancer. SOURCES OF FIBER  Whole-grain breads and cereals.  Fruits, such as apples, oranges, bananas, berries,  prunes, and pears.  Vegetables, such as green peas, carrots, sweet potatoes, beets, broccoli, cabbage, spinach, and artichokes.  Legumes, such split peas, soy, lentils.  Almonds. FIBER CONTENT IN FOODS Starches and Grains / Dietary Fiber (g)  Cheerios, 1 cup / 3 g  Corn Flakes cereal, 1 cup / 0.7 g  Rice crispy treat cereal, 1 cup / 0.3 g  Instant oatmeal (cooked),  cup / 2 g  Frosted wheat cereal, 1 cup / 5.1 g  Brown, long-grain rice (cooked), 1 cup / 3.5 g  White, long-grain rice (cooked), 1 cup / 0.6 g  Enriched macaroni (cooked), 1 cup / 2.5 g Legumes / Dietary Fiber (g)  Baked beans (canned, plain, or vegetarian),  cup / 5.2 g  Kidney beans (canned),  cup / 6.8 g  Pinto beans (cooked),  cup / 5.5 g Breads and Crackers / Dietary Fiber (g)  Plain or honey graham crackers, 2 squares / 0.7 g  Saltine crackers, 3 squares / 0.3 g  Plain, salted pretzels, 10 pieces / 1.8 g  Whole-wheat bread, 1 slice / 1.9 g  White bread, 1 slice / 0.7 g  Raisin bread, 1 slice / 1.2 g  Plain bagel, 3 oz / 2 g  Flour tortilla, 1 oz / 0.9 g  Corn tortilla, 1 small / 1.5 g  Hamburger or hotdog bun, 1 small / 0.9 g Fruits / Dietary Fiber (g)  Apple with skin, 1 medium / 4.4 g  Sweetened applesauce,  cup / 1.5 g  Banana,  medium / 1.5 g  Grapes, 10 grapes / 0.4 g  Orange, 1 small / 2.3 g  Raisin, 1.5 oz / 1.6 g  Melon, 1 cup / 1.4 g Vegetables / Dietary Fiber (g)  Green beans (canned),  cup / 1.3 g  Carrots (cooked),  cup / 2.3 g  Broccoli (cooked),  cup / 2.8 g  Peas (cooked),  cup / 4.4 g  Mashed potatoes,  cup / 1.6 g  Lettuce, 1 cup / 0.5 g  Corn (canned),  cup / 1.6 g  Tomato,  cup / 1.1 g Document Released: 03/02/2005 Document Revised: 09/01/2011 Document Reviewed: 06/04/2011 [employer] Patient Information 2015 [place], [place]. This information is not intended to replace advice given to you by your health care provider. Make sure you  discuss any questions you have with your health care provider.

## 2014-11-06 NOTE — Progress Notes (Signed)
I entered the room to begin encounter with patient and she requested to be only seen by Dr. Henrene Pastor.  Dr. Henrene Pastor notified and will see patient.

## 2014-11-06 NOTE — Progress Notes (Signed)
THIS RECORD MAY CONTAIN CONFIDENTIAL INFORMATION THAT SHOULD NOT BE RELEASED WITHOUT REVIEW OF THE SERVICE PROVIDER.  Adolescent Medicine Consultation Follow-Up Visit Stacey Moyer  is a 19 y.o. female referred by Ok Edwards, MD here today for follow-up of reproductive health concerns.    Previsit planning completed:  no  Growth Chart Viewed? not applicable   History was provided by the patient.  PCP Confirmed?  Yes but she would like to start transitioning to adult medicine  My Chart Activated?   yes   HPI:   Nexplanon inserted 07/18/2013 Getting periods twice monthly with the nexplanon but this has occurred only in the last 2 months.  Prior to that she was not getting any periods/bleeding.  She is overall satisfied with the nexplanon.  Concerned about possible infection, has dyspareunia.  Present for about 1 month.  Hurts more internally.  Yesterday noted external irritation and redness, similar to previous yeast infections.  No vaginal discharge.  No change in sexual partner, same one x 2 years, not using condoms.  Previous STI screens negative.  Patient's last menstrual period was 10/19/2014 (exact date). Allergies  Allergen Reactions  . Penicillins Rash    unknown     Medication List       This list is accurate as of: 11/06/14 10:34 PM.  Always use your most recent med list.               albuterol 108 (90 BASE) MCG/ACT inhaler  Commonly known as:  PROVENTIL HFA;VENTOLIN HFA  Inhale 2 puffs into the lungs every 6 (six) hours as needed. For shortness of breath     beclomethasone 80 MCG/ACT inhaler  Commonly known as:  QVAR  Inhale 2 puffs into the lungs 2 (two) times daily.     cetirizine 10 MG tablet  Commonly known as:  ZYRTEC  TAKE 1 TABLET BY MOUTH ONCE DAILY     clotrimazole 1 % cream  Commonly known as:  LOTRIMIN  Apply 1 application topically 2 (two) times daily.     fluconazole 100 MG tablet  Commonly known as:  DIFLUCAN  Take 3 tablets (300 mg  total) by mouth once a week. For 2 weeks     ibuprofen 600 MG tablet  Commonly known as:  ADVIL,MOTRIN  Take 1 tablet (600 mg total) by mouth every 6 (six) hours as needed.     montelukast 10 MG tablet  Commonly known as:  SINGULAIR  Take 1 tablet (10 mg total) by mouth daily.     NEXPLANON 68 MG Impl implant  Generic drug:  etonogestrel  1 each by Subdermal route once.        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  Has tried to loose weight but has not been successful.  The following portions of the patient's history were reviewed and updated as appropriate: allergies, current medications, past social history and problem list.  Physical Exam:  Filed Vitals:   11/06/14 1605  BP: 104/59  Pulse: 52  Height: 5' 2.5" (1.588 m)  Weight: 145 lb 3.2 oz (65.862 kg)   BP 104/59 mmHg  Pulse 52  Ht 5' 2.5" (1.588 m)  Wt 145 lb 3.2 oz (65.862 kg)  BMI 26.12 kg/m2  LMP 10/19/2014 (Exact Date) Body mass index: body mass index is 26.12 kg/(m^2). Facility age limit for growth percentiles is 20  years.  Physical Exam  Constitutional: No distress.  Cardiovascular: Normal rate and regular rhythm.   Abdominal: Soft. She exhibits no mass. There is no tenderness. There is no guarding.  Genitourinary: There is tenderness on the right labia. There is no rash on the right labia. There is tenderness (slight tenderness with palpation of vaginal orficie, clumped white discharge visible externally) on the left labia. There is no rash on the left labia. Cervix exhibits no motion tenderness, no discharge and no friability. There is erythema and tenderness in the vagina. No bleeding in the vagina. Vaginal discharge (clumped white discharge adhered to vaginal wall, vaginal mucose slightly erythematous) found.  Skin:  Scattered hypopigmented patches on arms and back c/w  [place] note and vitals reviewed.   Assessment/Plan: 1. Yeast vaginitis Symptoms most c/w yeast vaginitis.  Testing sent to confirm but given patient's level of discomfort and physical findings will start treatment now.  Because patient also has Tinea Versicolor will treat orally for that which is a higher dose and longer duration.  However, this will certainly adequately treat the vaginitis as well.  The topical clotrimazole can provide some external reliefe until the symptoms are fully cleared by the oral treatment. - fluconazole (DIFLUCAN) 100 MG tablet; Take 3 tablets (300 mg total) by mouth once a week. For 2 weeks  Dispense: 6 tablet; Refill: 0 - clotrimazole (LOTRIMIN) 1 % cream; Apply 1 application topically 2 (two) times daily.  Dispense: 30 g; Refill: 0  2. Tinea versicolor - fluconazole (DIFLUCAN) 100 MG tablet; Take 3 tablets (300 mg total) by mouth once a week. For 2 weeks  Dispense: 6 tablet; Refill: 0 Consider dermatology referral if not cleared after this regimen  3. Dysuria Results for orders placed or performed in visit on 11/06/14  POCT urinalysis dipstick  Result Value Ref Range   Color, UA yellow    Clarity, UA clear    Glucose, UA neg    Bilirubin, UA neg    Ketones, UA neg    Spec Grav, UA 1.010    Blood, UA neg    pH, UA 7.0    Protein, UA neg    Urobilinogen, UA negative    Nitrite, UA neg    Leukocytes, UA small (1+) (A) Negative  Results show small leuks.  Likely due to vaginitis.  Given physical exam UTI not likely.  Advised patient to return if continued dysuria about first dose of fluconazole.   4. Routine screening for STI (sexually transmitted infection) - WET PREP BY MOLECULAR PROBE - GC/Chlamydia Probe Amp  5. Weight gain Reviewed that her BMI is close to normal and what is more important is eating healthy.  She would like nutritional guidance to eat healthier and exercise at safe levels. - Amb ref to Medical Nutrition  Therapy-MNT   Follow-up:  Return if symptoms worsen or fail to improve.   Also provided telephone number for GYN and family medicine to initiate adult care in near future.  Medical decision-making:  > 40 minutes spent, more than 50% of appointment was spent discussing diagnosis and management of symptoms

## 2014-11-06 NOTE — Progress Notes (Deleted)
Adolescent Medicine Consultation Follow-Up Visit Stacey Moyer  is a 19 y.o. female referred by Loleta Chance, MD here today for follow-up of reproductive health concerns.   Previsit planning completed:  no  Growth Chart Viewed? no  PCP Confirmed?  Yes, Claudean Kinds, MD    History was provided by the patient.  HPI:  ***  No LMP recorded.  {Common ambulatory SmartLinks:19316}  Allergies  Allergen Reactions  . Penicillins Rash    unknown    Social History: Sleep:  *** Eating Habits: *** Screen Time:  *** Exercise: *** School: *** Future Plans: ***  Confidentiality was discussed with the patient and if applicable, with caregiver as well.  Patient's personal or confidential phone number: *** Tobacco? {YES/NO/WILD CARDS:18581} Secondhand smoke exposure?{YES/NO/WILD JQGBE:01007} Drugs/EtOH?{YES/NO/WILD HQRFX:58832} Sexually active?{YES/NO/WILD PQDIY:64158} Pregnancy Prevention: ***, reviewed condoms & plan B Safe at home, in school & in relationships? {Yes or If no, why not?:20788} Guns in the home? {YES/NO/WILD XENMM:76808} Safe to self? {Yes or If no, why not?:20788}  Physical Exam:  Filed Vitals:   11/06/14 1605  BP: 104/59  Pulse: 52  Height: 5' 2.5" (1.588 m)  Weight: 145 lb 3.2 oz (65.862 kg)   BP 104/59 mmHg  Pulse 52  Ht 5' 2.5" (1.588 m)  Wt 145 lb 3.2 oz (65.862 kg)  BMI 26.12 kg/m2  LMP 10/19/2014 (Exact Date) Body mass index: body mass index is 26.12 kg/(m^2). Facility age limit for growth percentiles is 20 years.  Physical Exam  Assessment/Plan: ***  Follow-up:  No Follow-up on file.   Medical decision-making:  > *** minutes spent, more than 50% of appointment was spent discussing diagnosis and management of symptoms

## 2014-11-07 ENCOUNTER — Encounter: Payer: Self-pay | Admitting: Pediatrics

## 2014-11-07 LAB — WET PREP BY MOLECULAR PROBE
Candida species: POSITIVE — AB
Gardnerella vaginalis: NEGATIVE
Trichomonas vaginosis: NEGATIVE

## 2014-11-07 LAB — GC/CHLAMYDIA PROBE AMP
CT PROBE, AMP APTIMA: NEGATIVE
GC Probe RNA: NEGATIVE

## 2014-11-13 ENCOUNTER — Encounter: Payer: Self-pay | Admitting: Pediatrics

## 2014-11-13 ENCOUNTER — Ambulatory Visit (INDEPENDENT_AMBULATORY_CARE_PROVIDER_SITE_OTHER): Payer: Self-pay | Admitting: Pediatrics

## 2014-11-13 VITALS — Wt 143.6 lb

## 2014-11-13 DIAGNOSIS — R21 Rash and other nonspecific skin eruption: Secondary | ICD-10-CM

## 2014-11-13 DIAGNOSIS — L259 Unspecified contact dermatitis, unspecified cause: Secondary | ICD-10-CM

## 2014-11-13 MED ORDER — HYDROXYZINE HCL 25 MG PO TABS: 25.0000 mg | ORAL_TABLET | Freq: Three times a day (TID) | ORAL | Status: DC | PRN

## 2014-11-13 MED ORDER — TRIAMCINOLONE ACETONIDE 0.1 % EX OINT: 1.0000 "application " | TOPICAL_OINTMENT | Freq: Two times a day (BID) | CUTANEOUS | Status: DC

## 2014-11-13 NOTE — Patient Instructions (Signed)
[place], your rash seems to be an allergic reaction- likely contact with grass or weeds. At times insect bites can appear like your rash & be itchy. You can use the steroid cream on the spots to help with itching & if it gets worse, you can take the anti-histamine by mouth at bedtime. It can make you drowsy so do not take the medication in the morning & drive.

## 2014-11-13 NOTE — Progress Notes (Signed)
    Subjective:    Stacey Moyer is a 19 y.o. female accompanied by self presenting to the clinic today with a chief c/o of rash on her right leg for the past 3 days. The lesions started as small spots & have spread on the back of her knee. They are very itchy and [place] is worried about an infection. She has not bene doing any gardening or yard work & avoids that. She recently had candidal vaginitis & is on fluconazole. She has been using her lotrimin cream on her leg. No other areas of rash. No other symptoms. Jodeci has a h/o eczema & allergy to poison ivy.   Review of Systems  Constitutional: Negative for fever, activity change and appetite change.  HENT: Negative for congestion.   Respiratory: Negative for cough.   Skin: Positive for rash.       Objective:   Physical Exam  Constitutional: She appears well-developed.  HENT:  Right Ear: External ear normal.  Left Ear: External ear normal.  Mouth/Throat: Oropharynx is clear and moist.  Eyes: Conjunctivae are normal.  Pulmonary/Chest: Breath sounds normal.  Skin: Rash (few papular lesions- pinpoint blisters on R leg popliteal area. few lesions on right ankle. Mild erythema. ) noted.   .Wt 143 lb 9.6 oz (65.137 kg)  LMP 10/19/2014 (Exact Date)        Assessment & Plan:  Contact dermatitis Unsure of exact trigger - triamcinolone ointment (KENALOG) 0.1 %; Apply 1 application topically 2 (two) times daily.  Dispense: 30 g; Refill: 0 - hydrOXYzine (ATARAX/VISTARIL) 25 MG tablet; Take 1 tablet (25 mg total) by mouth 3 (three) times daily as needed.  Dispense: 21 tablet; Refill: 0  Return if symptoms worsen or fail to improve.  Claudean Kinds, MD 11/13/2014 9:57 AM

## 2014-11-23 ENCOUNTER — Encounter: Payer: Self-pay | Admitting: Pediatrics

## 2014-11-30 ENCOUNTER — Encounter: Payer: Self-pay | Admitting: Pediatrics

## 2014-12-18 ENCOUNTER — Telehealth: Payer: Self-pay | Admitting: Pediatrics

## 2014-12-18 NOTE — Telephone Encounter (Signed)
TC to pt. LVM that advised pt can be seen earlier than November, but it may need to be with a NP, not MD. Phone number provided, stated she can ask to speak with Yao Hyppolite if front office is unable to schedule f/u before November.

## 2014-12-18 NOTE — Telephone Encounter (Signed)
[place] called today asking to make appointment but the first appt that we have is on November which she stated she need something sooner. She request to get a call from the doctor or nurse to see if we can fit her in sometimes this weeks. Her number is at [phone]

## 2014-12-24 ENCOUNTER — Emergency Department (INDEPENDENT_AMBULATORY_CARE_PROVIDER_SITE_OTHER)
Admission: EM | Admit: 2014-12-24 | Discharge: 2014-12-24 | Disposition: A | Discharge status: Home / Self Care | Payer: Self-pay | Source: Home / Self Care

## 2014-12-24 ENCOUNTER — Encounter: Payer: Self-pay | Admitting: *Deleted

## 2014-12-24 ENCOUNTER — Encounter (HOSPITAL_COMMUNITY): Payer: Self-pay | Admitting: *Deleted

## 2014-12-24 ENCOUNTER — Encounter: Payer: Self-pay | Attending: Pediatrics | Admitting: *Deleted

## 2014-12-24 ENCOUNTER — Emergency Department (INDEPENDENT_AMBULATORY_CARE_PROVIDER_SITE_OTHER): Payer: Self-pay

## 2014-12-24 VITALS — Ht 62.5 in | Wt 144.0 lb

## 2014-12-24 DIAGNOSIS — S93402A Sprain of unspecified ligament of left ankle, initial encounter: Secondary | ICD-10-CM

## 2014-12-24 DIAGNOSIS — Z713 Dietary counseling and surveillance: Secondary | ICD-10-CM | POA: Insufficient documentation

## 2014-12-24 DIAGNOSIS — E639 Nutritional deficiency, unspecified: Secondary | ICD-10-CM | POA: Insufficient documentation

## 2014-12-24 NOTE — ED Provider Notes (Signed)
CSN: [id number]     Arrival date & time 12/24/14  1854 History   None    Chief Complaint  Patient presents with  . Ankle Pain   (Consider location/radiation/quality/duration/timing/severity/associated sxs/prior Treatment) Patient is a 19 y.o. female presenting with ankle pain. The history is provided by the patient.  Ankle Pain Location:  Ankle Time since incident:  1 day Injury: yes   Mechanism of injury: fall   Mechanism of injury comment:  Was dancing and twisted. Fall:    Impact surface:  Hard floor Ankle location:  L ankle Pain details:    Radiates to:  Does not radiate   Severity:  Mild   Onset quality:  Gradual   Progression:  Unchanged Chronicity:  New Dislocation: no   Foreign body present:  No foreign bodies Prior injury to area:  No Relieved by:  None tried Worsened by:  Nothing tried Associated symptoms: no back pain, no muscle weakness, no numbness, no stiffness, no swelling and no tingling   Risk factors: no concern for non-accidental trauma and no frequent fractures     Past Medical History  Diagnosis Date  . Asthma   . Depression   . Anxiety    Past Surgical History  Procedure Laterality Date  . Tooth extraction     Family History  Problem Relation Age of Onset  . Asthma Brother   . Cancer Maternal Uncle    Social History  Substance Use Topics  . Smoking status: Never Smoker   . Smokeless tobacco: Never Used  . Alcohol Use: No   OB History    No data available     Review of Systems  Musculoskeletal: Positive for joint swelling and gait problem. Negative for back pain and stiffness.  Skin: Negative.   All other systems reviewed and are negative.   Allergies  Penicillins  Home Medications   Prior to Admission medications   Medication Sig Start Date End Date Taking? Authorizing Provider  albuterol (PROVENTIL HFA;VENTOLIN HFA) 108 (90 BASE) MCG/ACT inhaler Inhale 2 puffs into the lungs every 6 (six) hours as needed. For shortness of  breath 05/03/14   Shruti Anderson [place], MD  beclomethasone (QVAR) 80 MCG/ACT inhaler Inhale 2 puffs into the lungs 2 (two) times daily. Patient not taking: Reported on 12/24/2014 05/03/14   Shruti Anderson [place], MD  cetirizine (ZYRTEC) 10 MG tablet TAKE 1 TABLET BY MOUTH ONCE DAILY 07/03/14   Shruti Anderson [place], MD  clotrimazole (LOTRIMIN) 1 % cream Apply 1 application topically 2 (two) times daily. Patient not taking: Reported on 12/24/2014 11/06/14   Gaspar Skeeters, MD  etonogestrel (NEXPLANON) 68 MG IMPL implant 1 each by Subdermal route once.    Historical Provider, MD  fluconazole (DIFLUCAN) 100 MG tablet Take 3 tablets (300 mg total) by mouth once a week. For 2 weeks Patient not taking: Reported on 12/24/2014 11/06/14   Gaspar Skeeters, MD  hydrOXYzine (ATARAX/VISTARIL) 25 MG tablet Take 1 tablet (25 mg total) by mouth 3 (three) times daily as needed. Patient not taking: Reported on 12/24/2014 11/13/14   Shruti Anderson [place], MD  ibuprofen (ADVIL,MOTRIN) 600 MG tablet Take 1 tablet (600 mg total) by mouth every 6 (six) hours as needed. 06/27/14   Melony Overly, MD  montelukast (SINGULAIR) 10 MG tablet Take 1 tablet (10 mg total) by mouth daily. Patient not taking: Reported on 11/06/2014 02/13/14   Gaspar Skeeters, MD  triamcinolone ointment (KENALOG) 0.1 % Apply 1 application topically 2 (two)  times daily. 11/13/14   Shruti Anderson [place], MD   Meds Ordered and Administered this Visit  Medications - No data to display  BP 118/72 mmHg  Pulse 66  Temp(Src) 98.6 F (37 C) (Oral)  Resp 16  SpO2 100%  LMP 11/21/2014 (Exact Date) No data found.   Physical Exam  Constitutional: She is oriented to person, place, and time. She appears well-developed and well-nourished.  Musculoskeletal: She exhibits tenderness.       Left ankle: She exhibits decreased range of motion and swelling. She exhibits no deformity and normal pulse. Tenderness. Lateral malleolus tenderness found. No medial malleolus, no head of 5th metatarsal and no  proximal fibula tenderness found. Achilles tendon normal. Achilles tendon exhibits no pain, no defect and normal Thompson's test results.  Neurological: She is alert and oriented to person, place, and time.  Skin: Skin is warm and dry.  Nursing note and vitals reviewed.   ED Course  Procedures (including critical care time)  Labs Review Labs Reviewed - No data to display  Imaging Review Dg Ankle Complete Left  12/24/2014   CLINICAL DATA:  Left ankle injury 1 day ago, pain lateral left ankle.  EXAM: LEFT ANKLE COMPLETE - 3+ VIEW  COMPARISON:  None.  FINDINGS: Soft tissue swelling noted over the lateral malleolus. Osseous structures appear intact and well aligned throughout. No fracture line or displaced fracture fragment. Ankle mortise is symmetric. Bone mineralization is normal.  IMPRESSION: 1. No osseous fracture or dislocation. 2. Soft tissue swelling.   Electronically Signed   By: Franki Cabot M.D.   On: 12/24/2014 19:41  X-rays reviewed and report per radiologist.    Visual Acuity Review  Right Eye Distance:   Left Eye Distance:   Bilateral Distance:    Right Eye Near:   Left Eye Near:    Bilateral Near:         MDM   1. Ankle sprain, left, initial encounter    aso applied.    Billy Fischer, MD 12/24/14 [phone]

## 2014-12-24 NOTE — Patient Instructions (Signed)
Aim for 3 meals/day and avoid meal skipping (might need afternoon snack also) Aim to eat without distractions (no tv) Aim to eat more slowly (20 minutes)  Check out Intutive Eating by Marice Potter and [employer]

## 2014-12-24 NOTE — ED Notes (Signed)
Pt  Reports    She  Injured  Her  l  Ankle  Today   While  Dancing     She  Has  Pain  And  Swelling  To  The  Affected  Ankle          she  Is  Wearing  An   Ankle  Brace        She  Has  Been applying ice/   Elevation  To the  Affected   Ankle

## 2014-12-24 NOTE — Discharge Instructions (Signed)
Wear ankle support as needed for comfort, activity as tolerated. advil and ice as needed, return or see orthopedist if further problems. °

## 2014-12-24 NOTE — Progress Notes (Signed)
Medical Nutrition Therapy:  Appt start time: 1100 end time:  1200.  Assessment:  Primary concerns today: Stacey Moyer is here for nutrition counseling pertaining to referral for weight management.  She states she knows she's at a "healthy weight" but she's not happy with her weight.  She has clinical depression, per patient, and her mental health affects her eating.  Standard depression screening reveals depressive symptoms most days.  She has an appointment with behavior health 01/15/15 and was given [facility] contact information today in session.  Her weight fluctuates depending on her mental health  She states she overeats when she's upset.  And multiple times she has lost dramatic weight quickly from not eating.  She states when she lived at home, she ate "healthy," but now she doesn't since she lives alone.  Mom follows several fad diets over the years.  Jalana has many beliefs about food and nutrition not based on nutritional science.    Feels tired all the time Constipation: normal BM, but is a strain Lightheaded and headaches when she doesn't eat Feels "sick" a alot   Heaviest weight: currently: 144 lb Lowest weight: 115 lb (15-16 years) Most consistent weight: 130 Would like to weigh: 120-125 lb  In the past she has tried to "not eat as much" would go through periods when she wouldn't eat.  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   Lives with boyfriend.  She does most of the grocery shopping and cooking.  She likes to bake foods more often, and doesn't fry much.  States that "unhealthy foods" make her feel sick.   Eats out 2-3 times/week: diner or [employer].  When at home she eats at the table while watching tv.  She is a fast eater. (realizes she overeats)   Feels like she feels sick in the mornings.  Sometimes throws up.  "doesnt' have anything unhealthy at home." sugary foods make her "feel sick"   Administered EAT-26 Score significant >20 Patient score:  14   Preferred Learning Style:  No preference indicated   Learning Readiness:   Ready  MEDICATIONS: see list   DIETARY INTAKE:  Usual eating pattern includes 2-3 meals and 1 snacks per day.  Everyday foods include none.  Avoided foods include (eggs to certain point make her sick).    24-hr recall:  B ( AM): skipped  Snk ( AM): none  L ( PM): 6 in tuna sub from [employer], 1/2 Naked drink 4-5 pm fried tortilla with cabbage, mayo and sour cream (made her feel icky), sugary drink   Usual physical activity: dance 2-3 days for 2 hours  Estimated energy needs: 2400 calories 300 g carbohydrates 120 g protein 80 g fat  Estimated energy intake: 900 kcal    Nutritional Diagnosis:  NB-1.5 Disordered eating pattern As related to meal skipping and subsequent over-eating.  As evidenced by patient self-report.    Intervention:  Nutrition counseling provided.  Discussed metabolic effects of meal skipping and discouraged this practice.  Corrected myths and beliefs about food.  Discussed MyPlate recommendations for meal planning.  Discussed mindful eating: eating without distractions and eating more slowly. Discussed HAES principles briefly and how her "healthy weight" is where she honors her internal hunger and fullness cues.  That weight may be different than what a BMI chart mandates.  Talked about racial/ethnic differences in body type.  Discouraged adherence to diets or programs glorified on social media or tv  Teaching Method Utilized:  Auditory   Barriers  to learning/adherence to lifestyle change: planning ahead  Demonstrated degree of understanding via:  Teach Back   Monitoring/Evaluation:  Dietary intake, exercise, and body weight in 1 month(s).

## 2015-01-15 ENCOUNTER — Ambulatory Visit (HOSPITAL_COMMUNITY): Payer: Self-pay | Admitting: Psychiatry

## 2015-01-22 ENCOUNTER — Ambulatory Visit: Payer: Self-pay | Admitting: *Deleted

## 2015-01-23 ENCOUNTER — Ambulatory Visit (HOSPITAL_COMMUNITY): Payer: Self-pay | Admitting: Psychiatry

## 2015-01-28 ENCOUNTER — Ambulatory Visit (INDEPENDENT_AMBULATORY_CARE_PROVIDER_SITE_OTHER): Payer: Self-pay | Admitting: Pediatrics

## 2015-01-28 ENCOUNTER — Encounter: Payer: Self-pay | Admitting: Pediatrics

## 2015-01-28 VITALS — Temp 98.4°F | Wt 145.6 lb

## 2015-01-28 DIAGNOSIS — L309 Dermatitis, unspecified: Secondary | ICD-10-CM | POA: Insufficient documentation

## 2015-01-28 DIAGNOSIS — J302 Other seasonal allergic rhinitis: Secondary | ICD-10-CM

## 2015-01-28 DIAGNOSIS — J4531 Mild persistent asthma with (acute) exacerbation: Secondary | ICD-10-CM

## 2015-01-28 DIAGNOSIS — R21 Rash and other nonspecific skin eruption: Secondary | ICD-10-CM

## 2015-01-28 MED ORDER — HYDROXYZINE HCL 25 MG PO TABS: 25.0000 mg | ORAL_TABLET | Freq: Three times a day (TID) | ORAL | Status: DC | PRN

## 2015-01-28 MED ORDER — MONTELUKAST SODIUM 10 MG PO TABS: 10.0000 mg | ORAL_TABLET | Freq: Every day | ORAL | Status: DC

## 2015-01-28 MED ORDER — BECLOMETHASONE DIPROPIONATE 80 MCG/ACT IN AERS: 2.0000 | INHALATION_SPRAY | Freq: Two times a day (BID) | RESPIRATORY_TRACT | Status: DC

## 2015-01-28 MED ORDER — TRIAMCINOLONE ACETONIDE 0.1 % EX OINT: 1.0000 "application " | TOPICAL_OINTMENT | Freq: Two times a day (BID) | CUTANEOUS | Status: DC

## 2015-01-28 MED ORDER — ALBUTEROL SULFATE HFA 108 (90 BASE) MCG/ACT IN AERS: 2.0000 | INHALATION_SPRAY | Freq: Four times a day (QID) | RESPIRATORY_TRACT | Status: DC | PRN

## 2015-01-28 MED ORDER — FLUTICASONE PROPIONATE 50 MCG/ACT NA SUSP: 2.0000 | Freq: Every day | NASAL | Status: DC

## 2015-01-28 NOTE — Progress Notes (Signed)
History was provided by the patient.  Stacey Moyer is a 19 y.o. female who is here for rash, cough.     HPI:  Stacey Moyer is a 19 y.o. female with a history of mild intermittent asthma and allergic rhinitis who presents with rash and cough. The rash started on her left upper back about a week ago and has spread all down her back on both sides. She also has a few spots on her arms and her leg. She describes it as patches of dry skin that are very itchy. She is putting coconut oil on it and taking Zyrtec nightly but it seems to be getting worse. She has had spots similar to this on her arms before. Denies any exposures to new soaps, detergents, shampoo/conditioner, foods poison ivy, medications. States that the polyester shirt she wears to work makes the rash itchier. No fever. She developed cough and congestion a couple days ago. Cough is worse at night and she has some trouble breathing at night. No wheezing. Needs refills of albuterol and QVAR - has not been taking. Her asthma is usually triggered by season changes. Somewhat decreased appetite but drinking OK. Sick contacts: boyfriend also with cough, unsure who started coughing first. 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   Review of Systems  Constitutional: Positive for fatigue. Negative for fever, activity change and appetite change.  HENT: Positive for congestion, rhinorrhea and sore throat.   Eyes: Negative for redness.  Respiratory: Positive for cough. Negative for shortness of breath and wheezing.   Cardiovascular: Negative for chest pain.  Gastrointestinal: Positive for nausea. Negative for vomiting, abdominal pain, diarrhea, constipation and blood in stool.  Musculoskeletal: Positive for myalgias.  Skin: Positive for rash.    The following portions of the patient's history were reviewed and updated as appropriate: allergies, current medications, past medical history and problem list.  Physical Exam:  Temp(Src) 98.4 F (36.9 C)   Wt 145 lb 9.6 oz (66.044 kg)  LMP 01/24/2015    General:   alert, cooperative and no distress     Skin:   diffuse maculopapular rash on back with mild patchy erythema   Oral cavity:   lips, mucosa, and tongue normal; teeth and gums normal  Eyes:   sclerae white, pupils equal and reactive  Ears:   normal bilaterally  Nose: clear, no discharge  Neck:   supple, no adenopathy  Lungs:  clear to auscultation bilaterally, no wheezing, comfortable WOB  Heart:   regular rate and rhythm, S1, S2 normal, no murmur, click, rub or gallop   Abdomen:  soft, non-tender; bowel sounds normal; no masses,  no organomegaly  GU:  not examined  Extremities:   extremities normal, atraumatic, no cyanosis or edema  Neuro:  normal without focal findings    Assessment/Plan: Stacey Moyer is a 19 y.o. female with a history of mild intermittent asthma and allergic rhinitis who presents with rash and cough. On exam, lungs are CTAB with no wheezing or increased WOB. Diffuse maculopapular rash present on back with mild patchy erythema. Presentation consistent with acute asthma exacerbation in the setting of a viral illness. Also has pruritic, maculopapular rash consistent with contact dermatitis vs eczema.   1. Mild persistent asthma, with acute exacerbation - beclomethasone (QVAR) 80 MCG/ACT inhaler; Inhale 2 puffs into the lungs 2 (two) times daily.  Dispense: 1 Inhaler; Refill: 12 - albuterol (PROVENTIL HFA;VENTOLIN HFA) 108 (90 BASE) MCG/ACT inhaler; Inhale 2 puffs into the lungs every 6 (six) hours  as needed. For shortness of breath  Dispense: 1 Inhaler; Refill: 1  2. Other seasonal allergic rhinitis - fluticasone (FLONASE) 50 MCG/ACT nasal spray; Place 2 sprays into both nostrils daily.  Dispense: 16 g; Refill: 2 - montelukast (SINGULAIR) 10 MG tablet; Take 1 tablet (10 mg total) by mouth daily.  Dispense: 31 tablet; Refill: 11  3. Rash - hydrOXYzine (ATARAX/VISTARIL) 25 MG tablet; Take 1 tablet (25 mg total) by  mouth 3 (three) times daily as needed.  Dispense: 21 tablet; Refill: 0 - triamcinolone ointment (KENALOG) 0.1 %; Apply 1 application topically 2 (two) times daily.  Dispense: 30 g; Refill: 0 - recommended wearing cotton shirt under polyester to avoid irritation   Return in about 3 months (around 04/30/2015) for asthma f/u.  Eldred Manges, MD  01/28/2015

## 2015-02-15 ENCOUNTER — Encounter (HOSPITAL_COMMUNITY): Payer: Self-pay | Admitting: Psychiatry

## 2015-02-15 ENCOUNTER — Ambulatory Visit (INDEPENDENT_AMBULATORY_CARE_PROVIDER_SITE_OTHER): Payer: Self-pay | Admitting: Psychiatry

## 2015-02-15 VITALS — BP 104/68 | HR 63 | Ht 63.0 in | Wt 148.0 lb

## 2015-02-15 DIAGNOSIS — F332 Major depressive disorder, recurrent severe without psychotic features: Secondary | ICD-10-CM

## 2015-02-15 DIAGNOSIS — F330 Major depressive disorder, recurrent, mild: Secondary | ICD-10-CM

## 2015-02-15 MED ORDER — BUPROPION HCL ER (XL) 150 MG PO TB24: 150.0000 mg | ORAL_TABLET | ORAL | Status: DC

## 2015-02-15 NOTE — Progress Notes (Signed)
[facility] Initial Assessment Note  Taviana Okolo [id number] 19 y.o.  02/15/2015 9:55 AM  Chief Complaint:  I am getting depressed.  I think I need to go back on medication.  I'm feeling tired and I have no energy.  My grades are declining.  History of Present Illness:  Patient is 19 year old single, employed female who is self-referred seeking management for her depression and anxiety symptoms.  Patient told that she's been struggling with her depression most of her life and she had took medication in the past but due to insurance reason has been not able to see psychiatrist and continue her medication for more than 2 years.  She has noticed that her depression is getting worse.  She is feeling tired, decreased energy, discouragement, fatigue, sadness and lack of motivation to do things.  She admitted poor sleep and racing thoughts.  She wants to get into medical school however she started to have declining grades and her GPA fell and she unable to get financial aid.  She had decided to take a semester off and like to get focused to her treatment.  Patient has history of cutting herself however she has not done cutting and past few years.  In the past she used to take Wellbutrin which had helped her depression.  Though she denies any suicidal thoughts, paranoia or any hallucination but admitted sometime feeling of hopelessness and worthlessness.  She admitted anhedonia and some time crying spells.  She denies any panic attacks or any aggressive behavior but admitted getting easily frustrated and irritable.  She had a good support from her parents and her boyfriend.  She is working part-time at [employer] and she enjoys working with kids.  She wants to become a pediatrician.  Patient denies any mania, psychosis, OCD, PTSD or any aggressive behavior. She denies drinking or using any illegal substances.  Her appetite is okay.  Her vitals are stable.  Currently she is not seeing any  therapist but like to see a counselor to help her coping skills.  She lives with her boyfriend was very supportive.  She has no children.  Her parents and siblings live close by.  Suicidal Ideation: No Plan Formed: No Patient has means to carry out plan: No  Homicidal Ideation: No Plan Formed: No Patient has means to carry out plan: No  Past Psychiatric History/Hospitalization(s): Patient has history of depression since eighth grade and she has been seeking treatment since then.  She had tried antidepressant in the past but do not remember the details.  She had at least 3 psychiatric hospitalization.  She has history of cutting herself due to severe depression.  At age 40 she had first psychiatric hospitalization due to depression and cutting herself.  At age 80 she had taken overdose and require admission at behavioral [place] and one year later at age 89 she is again admitted at old [facility] hospital because of depression as she was declining grades at school.  [facility] years ago she turned 60 and she no longer have insurance.  Patient denies any history of mania, psychosis or any hallucination.  She denies any history of OCD or any PTSD symptoms.  As per chart she has taken Zoloft but patient do not remember the details. Anxiety: Yes Bipolar Disorder: No Depression: Yes Mania: No Psychosis: No Schizophrenia: No Personality Disorder: No Hospitalization for psychiatric illness: Yes History of Electroconvulsive Shock Therapy: No Prior Suicide Attempts: History  of cutting herself and taking overdose in the past  Medical History; Patient has seasonal allergies and asthma.  She see pediatrician at [facility].  Patient denies any history of seizures.  Traumatic brain injury: Patient denies any history of traumatic brain injury.  Family History; Patient endorse brother and father has history of depression.  Education and Work  History; Patient is in college at [employer] with major in biology.  She wants to do Pre-Med.  Currently she is taking semester off because her grades are declining.  She is working part-time at a [employer], [employer].  Psychosocial History; Patient born in [place] but grew up in [place].  Her parents are from [place].  She has 3 other siblings.  Patient is in a relationship with a boyfriend for past 3 years but is very supportive.  She has no children.  Patient had a good support from her parents and boyfriend.  Legal History; Patient denies any legal issues.  History Of Abuse; Patient denies any history of physical, sexual or verbal abuse.  She denies any nightmares or any flashback.  Substance Abuse History; Patient denies drinking or using any illegal substances.  Review of Systems: Psychiatric: Agitation: No Hallucination: No Depressed Mood: Yes Insomnia: Yes Hypersomnia: No Altered Concentration: No Feels Worthless: Yes Grandiose Ideas: No Belief In Special Powers: No New/Increased Substance Abuse: No Compulsions: No  Neurologic: Headache: No Seizure: No Paresthesias: No   Outpatient Encounter Prescriptions as of 02/15/2015  Medication Sig  . albuterol (PROVENTIL HFA;VENTOLIN HFA) 108 (90 BASE) MCG/ACT inhaler Inhale 2 puffs into the lungs every 6 (six) hours as needed. For shortness of breath  . beclomethasone (QVAR) 80 MCG/ACT inhaler Inhale 2 puffs into the lungs 2 (two) times daily.  Marland Kitchen buPROPion (WELLBUTRIN XL) 150 MG 24 hr tablet Take 1 tablet (150 mg total) by mouth every morning.  . cetirizine (ZYRTEC) 10 MG tablet TAKE 1 TABLET BY MOUTH ONCE DAILY  . etonogestrel (NEXPLANON) 68 MG IMPL implant 1 each by Subdermal route once.  . fluticasone (FLONASE) 50 MCG/ACT nasal spray Place 2 sprays into both nostrils daily.  Marland Kitchen ibuprofen (ADVIL,MOTRIN) 600 MG tablet Take 1 tablet (600 mg total) by mouth every 6 (six) hours as needed. (Patient not taking: Reported on  01/28/2015)  . triamcinolone ointment (KENALOG) 0.1 % Apply 1 application topically 2 (two) times daily.  . [DISCONTINUED] clotrimazole (LOTRIMIN) 1 % cream Apply 1 application topically 2 (two) times daily. (Patient not taking: Reported on 12/24/2014)  . [DISCONTINUED] fluconazole (DIFLUCAN) 100 MG tablet Take 3 tablets (300 mg total) by mouth once a week. For 2 weeks (Patient not taking: Reported on 12/24/2014)  . [DISCONTINUED] hydrOXYzine (ATARAX/VISTARIL) 25 MG tablet Take 1 tablet (25 mg total) by mouth 3 (three) times daily as needed.  . [DISCONTINUED] montelukast (SINGULAIR) 10 MG tablet Take 1 tablet (10 mg total) by mouth daily.   No facility-administered encounter medications on file as of 02/15/2015.    No results found for this or any previous visit (from the past 2160 hour(s)).    Constitutional:  BP 104/68 mmHg  Pulse 63  Ht 5\' 3"  (1.6 m)  Wt 148 lb (67.132 kg)  BMI 26.22 kg/m2  LMP 01/24/2015   Musculoskeletal: Strength & Muscle Tone: within normal limits Gait & Station: normal Patient leans: N/A  Psychiatric Specialty Exam: General Appearance: Casual  Eye Contact::  Good  Speech:  Slow  Volume:  Normal  Mood:  Anxious and Depressed  Affect:  Depressed  Thought Process:  Coherent and Intact  Orientation:  Full (Time, Place, and Person)  Thought Content:  Rumination  Suicidal Thoughts:  No  Homicidal Thoughts:  No  Memory:  Immediate;   Fair Recent;   Fair Remote;   Good  Judgement:  Good  Insight:  Good  Psychomotor Activity:  Normal  Concentration:  Fair  Recall:  [place] of Knowledge:  Good  Language:  Good  Akathisia:  No  Handed:  Right  AIMS (if indicated):     Assets:  Communication Skills Desire for Improvement Financial Resources/Insurance Housing Physical Health Social Support  ADL's:  Intact  Cognition:  WNL  Sleep:        Established Problem, Stable/Improving (1), Review of Psycho-Social Stressors (1), Review or order  clinical lab tests (1), Decision to obtain old records (1), Review and summation of old records (2), Established Problem, Worsening (2), New Problem, with no additional work-up planned (3), Review of Medication Regimen & Side Effects (2) and Review of New Medication or Change in Dosage (2)  Assessment: Axis I: Major depressive disorder, recurrent mild without psychosis  Axis II: Deferred  Axis III:  Past Medical History  Diagnosis Date  . Asthma   . Depression   . Anxiety      Plan:  I review her symptoms, history, collateral information, current medication, recent blood work and current medication.  She is experiencing depression and she liked to go back on medication.  In the past she had tried Wellbutrin with a good response.  We will start Wellbutrin XL 150 mg daily.  Discussed medication side effects and benefits.  I do believe patient also need to see a therapist in this office for coping skills.  We will schedule appointment with [name].  We'll get records [facility].  Discuss safety plan that anytime having active suicidal thoughts or homicidal thoughts and she need to call 911 or go to the local emergency room.  Recommended to call us back if she has any question or any concern.  Follow-up in 3-4 weeks.  ARFEEN,SYED T., MD 02/15/2015

## 2015-03-13 ENCOUNTER — Ambulatory Visit (INDEPENDENT_AMBULATORY_CARE_PROVIDER_SITE_OTHER): Payer: Self-pay | Admitting: Psychology

## 2015-03-13 ENCOUNTER — Encounter (HOSPITAL_COMMUNITY): Payer: Self-pay | Admitting: Psychology

## 2015-03-13 DIAGNOSIS — F330 Major depressive disorder, recurrent, mild: Secondary | ICD-10-CM

## 2015-03-13 NOTE — Progress Notes (Signed)
Comprehensive Clinical Assessment (CCA) Note  03/13/2015 Stacey Moyer [id number]  Visit Diagnosis:      ICD-9-CM ICD-10-CM   1. Mild episode of recurrent major depressive disorder ([place]) 296.31 F33.0       CCA Part One  Part One has been completed on paper by the patient.  (See scanned document in Chart Review)  CCA Part Two A  Intake/Chief Complaint:  CCA Intake With Chief Complaint CCA Part Two Date: 03/13/15 CCA Part Two Time: 33 Chief Complaint/Presenting Problem: pt is referred for cousneling by Dr. Adele Schilder who she began tx with 02/15/15.  Pt reported that her parents sought tx for her following dx by PCP of depression at age 19y/o.  pt reported she wasn't receptive to counseling as a teen as part of rebellion.  pt reported she was prescribed Wellbutrin in the past as well.  Pt reported that when she graduated from high school and lost her medicaid she stopped going to tx and taking her medication as couldn't afford.  pt reports she has gone through periods of depression since w/ periods of moderate to severe loss of interest and motivation, lack of energy, becoming ovewhelmed by stress and not putting efforts in academically. pt reported that this resulted in drop in grades and  2 semesters off from college courses in the past 2 years.  Pt reports that she currently isn't  eliigble for financial aid due to current GPA so is returning to school this semester and paying out of pocket for classes.  pt has hx of cutting as a teen- but no cutting or urge for in several years.  Patients Currently Reported Symptoms/Problems: pt reports that she has been consistent about taking her meds daily in the past 2.5 weeks.  pt reports that she feels some improvement with mood recent- happy days, feeling more motivated and accomplishing things around the house.  pt reported that she has started exercising this week.  pt reported that she is excited about started school again on January 9th, 2017.  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  pt reported that her stressors are from feeling expecations from parents of being a good example and following through on the education opportunities she has that they didn't.  Pt also reports that her parents don't approve of her realtionship with  boyfriend and verbalize  this often which is a stressor.  Pt also reports that when she becomes stressed, she will become easily overwhelmed and stop putting efforts.  Pt reports stills struggles w/ difficulty initiating sleep.  pt also reports loss of intersest and energy most days.  Collateral Involvement: Dr. Marguerite Olea evaluation Individual's Strengths: Boyfriend Supportive, parents supportive "in their way", persistance,  Goal directed, likes kids, working, wants to be a pediatrician Individual's Preferences: getting my life and self organized and finding a way not to let things get me stressed and overwhelmed. Type of Services Patient Feels Are Needed: counseling and medication  Mental Health Symptoms Depression:  Depression: Change in energy/activity, Difficulty Concentrating, Fatigue, Sleep (too much or little), Worthlessness  Mania:  Mania: N/A  Anxiety:   Anxiety: Difficulty concentrating, Fatigue, Worrying  Psychosis:  Psychosis: N/A  Trauma:  Trauma: N/A  Obsessions:  Obsessions: N/A  Compulsions:  Compulsions: N/A  Inattention:  Inattention: Poor follow-through on tasks, Disorganized  Hyperactivity/Impulsivity:  Hyperactivity/Impulsivity: N/A  Oppositional/Defiant Behaviors:  Oppositional/Defiant Behaviors: N/A  Borderline Personality:  Emotional Irregularity: N/A  Other  Mood/Personality Symptoms:      Mental Status Exam Appearance and self-care  Stature:  Stature: Average  Weight:  Weight: Average weight  Clothing:  Clothing: Neat/clean  Grooming:  Grooming: Well-groomed  Cosmetic use:  Cosmetic Use: Age appropriate  Posture/gait:  Posture/Gait:  Normal  Motor activity:  Motor Activity: Not Remarkable  Sensorium  Attention:  Attention: Normal  Concentration:  Concentration: Normal  Orientation:  Orientation: X5  Recall/memory:  Recall/Memory: Normal  Affect and Mood  Affect:  Affect: Appropriate  Mood:  Mood: Depressed  Relating  Eye contact:  Eye Contact: Normal  Facial expression:  Facial Expression: Responsive  Attitude toward examiner:  Attitude Toward Examiner: Cooperative  Thought and Language  Speech flow: Speech Flow: Normal  Thought content:  Thought Content: Appropriate to mood and circumstances  Preoccupation:     Hallucinations:     Organization:     Transport planner of Knowledge:  Fund of Knowledge: Average  Intelligence:  Intelligence: Average  Abstraction:  Abstraction: Normal  Judgement:  Judgement: Normal  Reality Testing:  Reality Testing: Adequate  Insight:  Insight: Good  Decision Making:  Decision Making: Normal  Social Functioning  Social Maturity:  Social Maturity: Responsible  Social Judgement:  Social Judgement: Normal  Stress  Stressors:  Stressors: Family conflict (school)  Coping Ability:  Coping Ability: English as a second language teacher Deficits:     Supports:      Family and Psychosocial History: Family history Marital status: Single Are you sexually active?: Yes Does patient have children?: No  Childhood History:  Childhood History By whom was/is the patient raised?: Both parents Additional childhood history information: Her parents are both from [place].  She was born in [place] and moved with her parents to [place], [place] when she was 19y/o.  Pt reports that her father suffered from depression when she was younger and would get very easily irritable and would be rough in his discipline till sought tx.  pt reported that she was oppositional and persistant as a child and teen and a lot of conflict w/ parents as a teen. mom "kicked her out" when she turned 19 y/o.  Pt reported 'I kinda  pushed things wanting her to".  Pt moved in w/her boyfriend and his mom when she was 45 and boyfriend and her moved out into their own apartment 1 year ago.   Description of patient's relationship with caregiver when they were a child: pt reports mom was difficult to get along with when she was a teen.   Patient's description of current relationship with people who raised him/her: Pt reports they are supportive of her in their own way.  pt reports that they still judge her for her decisions and voice to her disapproval of her relationship.  pt reports also voice expectations of her being role model for younger siblings.  Does patient have siblings?: Yes Number of Siblings: 3 Description of patient's current relationship with siblings: pt has a 64y/o brother, 13y/o brother and 34 y/o sister.  pt reports that her older brother and her have become closer recently.  pt reports that her 13y/o brother also deals w/ depression and ADHD and can be annoying to interact with.  pt reports that younger sister enjoys her visiting.  pt reports that she likes to visit w/her siblings.  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  How long has patient been employed?: 2 years Patient's job has been impacted by current illness: No What is the longest time patient has a held a job?: 2 years Where was the patient employed at that time?:  current employer Has patient ever been in the [employer]?: No Are There Guns or Other Weapons in [place]?: No  Education: Museum/gallery curator Currently Attending: [employer] Last Grade Completed: 12 Did Teacher, adult education From [employer]?: Yes Did Physicist, medical?:  (currently attending) Did You Have An Individualized Education Program (IIEP): No Did You Have Any Difficulty At School?: Yes Were Any Medications Ever Prescribed For These Difficulties?: Yes Medications Prescribed For School Difficulties?: medication for depression  Religion: Religion/Spirituality Are You A Religious Person?: Yes What is Your Religious Affiliation?: Catholic  Leisure/Recreation: Leisure / Recreation Leisure and Hobbies: participates in traditional [place] dance, used to play the flute, reading, knitting, outdoors  Exercise/Diet: Exercise/Diet Do You Exercise?: Yes What Type of Exercise Do You Do?: Dance How Many Times a Week Do You Exercise?: 1-3 times a week Have You Gained or Lost A Significant Amount of Weight in the Past Six Months?: No Do You Follow a Special Diet?: No Do You Have Any Trouble Sleeping?: Yes Explanation of Sleeping Difficulties: Difficulty falling asleep  CCA Part Two C  Alcohol/Drug Use: Alcohol / Drug Use Pain Medications: none Prescriptions: Wellbutrin History of alcohol / drug use?: No history of alcohol / drug abuse                      CCA Part Three  ASAM's:  Six Dimensions of Multidimensional Assessment  Dimension 1:  Acute Intoxication and/or Withdrawal Potential:     Dimension 2:  Biomedical Conditions and Complications:     Dimension 3:  Emotional, Behavioral, or Cognitive Conditions and Complications:     Dimension 4:  Readiness to Change:     Dimension 5:  Relapse, Continued use, or Continued Problem Potential:     Dimension 6:  Recovery/Living Environment:      Substance use Disorder (SUD)    Social Function:  Social Functioning Social Maturity:  Responsible Social Judgement: Normal  Stress:  Stress Stressors: Family conflict (school) Coping Ability: Overwhelmed Patient Takes Medications The Way The Doctor Instructed?: Other (Comment) (pt reports she has always struggled w/ taking medication daily.  pt reports that she has been more consiistent the past 2.weeks taking her medication daily. ) Priority Risk: Low Acuity  Risk Assessment- Self-Harm Potential: Risk Assessment For Self-Harm Potential Thoughts of Self-Harm: No current thoughts Method: No plan Availability of Means: No access/NA Additional Information for Self-Harm Potential: Acts of Self-harm Additional Comments for Self-Harm Potential: pt was admited to inpt as a teen for SI and cut as a teen.  pt reports that she hasn't cut or had any thoughts of self harm in a couple of years.  Pt reports no SI in several years.    Risk Assessment -Dangerous to Others Potential: Risk Assessment For Dangerous to Others Potential Method: No Plan  DSM5 Diagnoses: Patient Active Problem List   Diagnosis Date Noted  .  Eczema 01/28/2015  . Weight loss 05/03/2014  . Postinflammatory hypopigmentation 02/13/2014  . Dysmenorrhea 02/13/2014  . Patellar subluxation 01/15/2014  . Presence of subdermal contraceptive implant 02/15/2013  . Allergic rhinitis 09/19/2012  . Mild persistent asthma 09/19/2012    Patient Centered Plan: Patient is on the following Treatment Plan(s):  Depression  Recommendations for Services/Supports/Treatments: Recommendations for Services/Supports/Treatments Recommendations For Services/Supports/Treatments: Medication Management, Individual Therapy  Treatment Plan Summary:  Pt to f/u w/ individual counseling in 2 weeks.  Pt to continue as scheduled w/ Dr. Adele Schilder and take medication as prescribed.     Jan Fireman

## 2015-03-21 ENCOUNTER — Ambulatory Visit (HOSPITAL_COMMUNITY): Payer: Self-pay | Admitting: Psychology

## 2015-03-22 MED FILL — ?BUPROPION HCL XL 150 MG TA: 150 | 30 days supply | Qty: 30 | Fill #1

## 2015-03-29 ENCOUNTER — Ambulatory Visit (HOSPITAL_COMMUNITY): Payer: Self-pay | Admitting: Psychiatry

## 2015-04-11 ENCOUNTER — Ambulatory Visit (HOSPITAL_COMMUNITY): Payer: Self-pay | Admitting: Psychology

## 2015-04-21 ENCOUNTER — Encounter: Payer: Self-pay | Admitting: Pediatrics

## 2015-04-25 ENCOUNTER — Ambulatory Visit (INDEPENDENT_AMBULATORY_CARE_PROVIDER_SITE_OTHER): Payer: Medicaid Other | Admitting: Pediatrics

## 2015-04-25 ENCOUNTER — Ambulatory Visit (INDEPENDENT_AMBULATORY_CARE_PROVIDER_SITE_OTHER): Payer: Self-pay | Admitting: Psychology

## 2015-04-25 ENCOUNTER — Encounter: Payer: Self-pay | Admitting: Pediatrics

## 2015-04-25 VITALS — Temp 98.6°F | Wt 146.8 lb

## 2015-04-25 DIAGNOSIS — F330 Major depressive disorder, recurrent, mild: Secondary | ICD-10-CM

## 2015-04-25 DIAGNOSIS — N760 Acute vaginitis: Secondary | ICD-10-CM

## 2015-04-25 DIAGNOSIS — B379 Candidiasis, unspecified: Secondary | ICD-10-CM

## 2015-04-25 LAB — POCT URINALYSIS DIPSTICK
BILIRUBIN UA: NEGATIVE
Blood, UA: NEGATIVE
Glucose, UA: NORMAL
KETONES UA: NEGATIVE
Nitrite, UA: NEGATIVE
PH UA: 8
Spec Grav, UA: <=1.005
Urobilinogen, UA: NEGATIVE

## 2015-04-25 MED ORDER — FLUCONAZOLE 100 MG PO TABS: 300.0000 mg | ORAL_TABLET | ORAL | Status: DC

## 2015-04-25 MED FILL — FLUCONAZOLE 100 MG TABLET: 100 | 14 days supply | Qty: 6 | Fill #0

## 2015-04-25 NOTE — Progress Notes (Signed)
    Subjective:    Stacey Moyer is a 20 y.o. female accompanied by self presenting to the clinic today with a chief c/o of vaginal discharge, itching & pain. She reports that she is having thick white discharge & discomfort for the past week. She has been using OTC anti-yeast vaginal cream bu that is not helping. She reports to have similar symptoms 5 months back when she was seen by Dr Henrene Pastor & had a positive yeast on culture & was treated with Fluconazole. The symptoms improved with oral meds but since then she has been having milder episodes during the time of her spotting or cycles. She has the Nexplanon but gets a menstrual cycle every month. No bleeding this month. The vaginal symptoms usually get better with topical creams but it has worsened this time & she is having dyspareunia.  No h/o STI or UTI.  She is trying to transition to family practice as she is turning 20 in 3 months.   Review of Systems  Constitutional: Negative for activity change and appetite change.  Genitourinary: Positive for vaginal discharge and dyspareunia.       Objective:   Physical Exam  Constitutional: She appears well-developed. No distress.  Cardiovascular: Normal rate and regular rhythm.   Abdominal: Soft. She exhibits no mass. There is no tenderness. There is no guarding.  Genitourinary:  External genital exam only. Pelvic not done.  tenderness with palpation of vaginal orficie, clumped white discharge visible , erythema at 6 O Clock on vaginal orifice  Skin: No rash noted.  Nursing note and vitals reviewed.  .Temp(Src) 98.6 F (37 C)  Wt 146 lb 12.8 oz (66.588 kg)  LMP 03/25/2015        Assessment & Plan:  Vaginitis Exam cosistent with yeast infection - POCT urinalysis dipstick- normal - WET PREP BY MOLECULAR PROBE requested. Will treat with course of oral anti-fungal as not responsive to topical treatment.  - fluconazole (DIFLUCAN) 100 MG tablet; Take 3 tablets (300 mg total) by mouth  once a week. For 2 weeks  Dispense: 6 tablet; Refill: 0  Advised to return if symptoms not better to further follow up resistant to fluconazole & reason for recurrent candidiial infections.  The visit lasted for 25 minutes and > 50% of the visit time was spent on counseling regarding the treatment plan and importance of compliance with chosen management options. Return if symptoms worsen or fail to improve.  Claudean Kinds, MD 04/27/2015 7:12 PM

## 2015-04-25 NOTE — Patient Instructions (Signed)

## 2015-04-25 NOTE — Progress Notes (Signed)
   THERAPIST PROGRESS NOTE  Session Time: 9:03am-9.50am  Participation Level: Active  Behavioral Response: Well GroomedAlert, STRESSED  Type of Therapy: Individual Therapy  Treatment Goals addressed: Diagnosis: MDd and goal 1.  Interventions: CBT, Motivational Interviewing and Supportive  Summary: Stacey Moyer is a 20 y.o. female who presents with report of feeling stressed w/ school, work balance. Pt reported that she received an F on recent biology test- initially blaming that professor can't teach. Pt was able to recognize that she has procrastinated, not completing reading and needs to look into tutoring. Pt discussed that she isn't managing her time well and spending too much time watching netflix etc.  Pt discussed creating a scheduling that is supportive of time for school work and study.  Pt also reported studying environment and going to [employer] instead.  Pt reported there is added stressor or moving next month.  Pt is looking forward to space moving into however.  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  Pt discussed how she can communicate this to supervisor in assertive way.   Suicidal/Homicidal: Nowithout intent/plan  Therapist Response: Assessed pt current functioning per pt report.  Processed w/pt her stressors w/ school and barriers to meeting goals she has for school.  Discussed changes she can make for self to take responsibility and look for different outcomes.  Discussed assertive communication.    Plan: Return again in 2 weeks.  Diagnosis: MDD    Jan Fireman, [employer] 04/25/2015

## 2015-04-26 ENCOUNTER — Telehealth: Payer: Self-pay

## 2015-04-26 LAB — WET PREP BY MOLECULAR PROBE
Candida species: POSITIVE — AB
Gardnerella vaginalis: NEGATIVE
Trichomonas vaginosis: NEGATIVE

## 2015-04-26 NOTE — Telephone Encounter (Signed)
The results for wet prep have already been recorded so unsure what the issue is. We do not need another urine culture. Thanks.  Claudean Kinds, MD [place] [facility] [place], [place] [address] Ph: [phone] Fax: [phone] 04/26/2015 10:41 AM

## 2015-04-26 NOTE — Telephone Encounter (Signed)
Patients urine culture and wet prep were not labeled and must be recollected.

## 2015-04-27 DIAGNOSIS — B379 Candidiasis, unspecified: Secondary | ICD-10-CM | POA: Insufficient documentation

## 2015-04-30 ENCOUNTER — Ambulatory Visit (INDEPENDENT_AMBULATORY_CARE_PROVIDER_SITE_OTHER): Payer: Medicaid Other | Admitting: Psychiatry

## 2015-04-30 ENCOUNTER — Encounter (HOSPITAL_COMMUNITY): Payer: Self-pay | Admitting: Psychiatry

## 2015-04-30 VITALS — BP 110/62 | HR 61 | Ht 63.0 in | Wt 143.6 lb

## 2015-04-30 DIAGNOSIS — F330 Major depressive disorder, recurrent, mild: Secondary | ICD-10-CM

## 2015-04-30 MED ORDER — BUPROPION HCL ER (XL) 300 MG PO TB24: 300.0000 mg | ORAL_TABLET | ORAL | Status: DC

## 2015-04-30 MED FILL — ?BUPROPION HCL XL 300 MG TA: 300 | 30 days supply | Qty: 30 | Fill #0

## 2015-04-30 NOTE — Progress Notes (Signed)
[facility] Progress Note  Stacey Moyer [id number] 20 y.o.  04/30/2015 11:13 AM  Chief Complaint:  I am feeling better but I still have some time anxiety and nervousness.  I'm stressed about school.  I'm taking 5 classes.   History of Present Illness:  Stacey Moyer is 20 year old single female who was seen first time on December 2 as initial evaluation.  Patient has history of depression and anxiety symptoms.  She reported that time that she has struggling with her depression and very concerned about her grades in school.  At that time she was reported tired, decreased energy, discouragement, fatigue sadness and lack of motivation.  She had a good response with Wellbutrin.  Patient has history of cutting herself however she has not done in past few years.  She was concerned because her grades were declining and her GPA fell.  Since started Wellbutrin she is feeling much better.  She is less depressed and her energy level is improved from the past.  However she still feels some time anxious and nervous.  She is taking 5 classes at [employer].  She is taking [place], biology, public speaking .  She is hoping to transfer [employer] in [place].  Though she denies any feeling of hopelessness or worthlessness but is still some time admitted crying spells and anxious about her school.  Recently she had yeast infection and she is seen by physician given medication.  She reported no side effects of medication.  She has no tremors, shakes, irritability.  She sleeping better.  She is wondering if the dose can increase to 300.  Patient lives with her boyfriend.  She mentioned relationship is going very well.  She has no children.  She had a good support from her parents and boyfriend.  Patient denies drinking or using any illegal substances.  Her appetite is okay.  Her vitals are stable.  Patient seen [name] for counseling.  Suicidal Ideation: No Plan Formed: No Patient has means to carry out plan:  No  Homicidal Ideation: No Plan Formed: No Patient has means to carry out plan: No  Past Psychiatric History/Hospitalization(s): Patient has history of depression since eighth grade. She had tried antidepressant in the past but do not remember the details.  She had at least 3 psychiatric hospitalization.  She has history of cutting herself due to severe depression.  At age 20 she had first psychiatric hospitalization due to depression and cutting herself.  At age 39 she had taken overdose and require admission at behavioral [facility] and one year later at age 66 she is again admitted at [facility] because of depression as she was declining grades at school.  [facility] years ago she turned 1 and she no longer have insurance.  Patient denies any history of mania, psychosis or any hallucination.  She denies any history of OCD or any PTSD symptoms.  As per chart she has taken Zoloft but patient do not remember the details. Anxiety: Yes Bipolar Disorder: No Depression: Yes Mania: No Psychosis: No Schizophrenia: No Personality Disorder: No Hospitalization for psychiatric illness: Yes History of Electroconvulsive Shock Therapy: No Prior Suicide Attempts: History of cutting herself and taking overdose in the past  Medical History; Patient has seasonal allergies and asthma.  She see pediatrician at [facility].  Patient denies any history of seizures.  Family History; Patient endorse brother and father has history of depression.  Review of Systems:  Psychiatric: Agitation: No Hallucination: No Depressed Mood: Yes Insomnia: No Hypersomnia: No Altered Concentration: No Feels Worthless: No Grandiose Ideas: No Belief In Special Powers: No New/Increased Substance Abuse: No Compulsions: No  Neurologic: Headache: No Seizure: No Paresthesias: No   Outpatient Encounter Prescriptions as of 04/30/2015  Medication Sig  .  albuterol (PROVENTIL HFA;VENTOLIN HFA) 108 (90 BASE) MCG/ACT inhaler Inhale 2 puffs into the lungs every 6 (six) hours as needed. For shortness of breath  . beclomethasone (QVAR) 80 MCG/ACT inhaler Inhale 2 puffs into the lungs 2 (two) times daily.  Marland Kitchen buPROPion (WELLBUTRIN XL) 300 MG 24 hr tablet Take 1 tablet (300 mg total) by mouth every morning.  . cetirizine (ZYRTEC) 10 MG tablet TAKE 1 TABLET BY MOUTH ONCE DAILY  . etonogestrel (NEXPLANON) 68 MG IMPL implant 1 each by Subdermal route once.  . fluconazole (DIFLUCAN) 100 MG tablet Take 3 tablets (300 mg total) by mouth once a week. For 2 weeks  . fluticasone (FLONASE) 50 MCG/ACT nasal spray Place 2 sprays into both nostrils daily. (Patient not taking: Reported on 03/13/2015)  . ibuprofen (ADVIL,MOTRIN) 600 MG tablet Take 1 tablet (600 mg total) by mouth every 6 (six) hours as needed. (Patient not taking: Reported on 01/28/2015)  . montelukast (SINGULAIR) 10 MG tablet Take 10 mg by mouth at bedtime.  . triamcinolone ointment (KENALOG) 0.1 % Apply 1 application topically 2 (two) times daily.  . [DISCONTINUED] buPROPion (WELLBUTRIN XL) 150 MG 24 hr tablet Take 1 tablet (150 mg total) by mouth every morning.   No facility-administered encounter medications on file as of 04/30/2015.    Recent Results (from the past 2160 hour(s))  POCT urinalysis dipstick     Status: Abnormal   Collection Time: 04/25/15 11:46 AM  Result Value Ref Range   Color, UA yellow    Clarity, UA clear    Glucose, UA normal    Bilirubin, UA neg    Ketones, UA neg    Spec Grav, UA <=1.005    Blood, UA neg    pH, UA 8.0    Protein, UA trace    Urobilinogen, UA negative    Nitrite, UA neg    Leukocytes, UA Trace (A) Negative  WET PREP BY MOLECULAR PROBE     Status: Abnormal   Collection Time: 04/25/15 12:03 PM  Result Value Ref Range   Candida species POS (A) Negative   Trichomonas vaginosis NEG Negative   Gardnerella vaginalis NEG Negative       Constitutional:  BP 110/62 mmHg  Pulse 61  Ht 5\' 3"  (1.6 m)  Wt 143 lb 9.6 oz (65.137 kg)  BMI 25.44 kg/m2  LMP 04/29/2015   Musculoskeletal: Strength & Muscle Tone: within normal limits Gait & Station: normal Patient leans: N/A  Psychiatric Specialty Exam: General Appearance: Casual  Eye Contact::  Good  Speech:  Slow  Volume:  Normal  Mood:  Anxious  Affect:  Depressed  Thought Process:  Coherent and Intact  Orientation:  Full (Time, Place, and Person)  Thought Content:  Rumination  Suicidal Thoughts:  No  Homicidal Thoughts:  No  Memory:  Immediate;   Fair Recent;   Fair Remote;   Good  Judgement:  Good  Insight:  Good  Psychomotor Activity:  Normal  Concentration:  Fair  Recall:  [place] of Knowledge:  Good  Language:  Good  Akathisia:  No  Handed:  Right  AIMS (if indicated):     Assets:  Communication Skills Desire  for Improvement Financial Resources/Insurance Housing Physical Health Social Support  ADL's:  Intact  Cognition:  WNL  Sleep:        Established Problem, Stable/Improving (1), Review of Psycho-Social Stressors (1), Review or order clinical lab tests (1), Review and summation of old records (2), Review of Last Therapy Session (1), Review of Medication Regimen & Side Effects (2) and Review of New Medication or Change in Dosage (2)  Assessment: Axis I: Major depressive disorder, recurrent mild without psychosis  Axis II: Deferred  Axis III:  Past Medical History  Diagnosis Date  . Asthma   . Depression   . Anxiety   . Seasonal allergies      Plan:  Patient is doing better with Wellbutrin XL 150 mg.  She has no side effects.  I reviewed her blood work and her current medication.  Recommended to try Wellbutrin XL 300 mg daily.  Encouraged to keep appointment with [name] for counseling.  Discussed medication side effects and benefits and recommended to call us back if she has any question or any concern.  Follow-up in 2  months. We do not get records from her previous provider yet.  Discuss safety plan that anytime having active suicidal thoughts or homicidal thoughts and she need to call 911 or go to the local emergency room.  Recommended to call us back if she has any question or any concern.  Follow-up in 3-4 weeks.  Tiberius Loftus T., MD 04/30/2015

## 2015-05-23 ENCOUNTER — Encounter (HOSPITAL_COMMUNITY): Payer: Self-pay | Admitting: Psychology

## 2015-05-23 ENCOUNTER — Ambulatory Visit (HOSPITAL_COMMUNITY): Payer: Self-pay | Admitting: Psychology

## 2015-05-23 NOTE — Progress Notes (Signed)
Stacey Moyer is a 20 y.o. female patient who didn't show for her appointment.  Letter sent.        Jan Fireman, LPC

## 2015-06-05 ENCOUNTER — Other Ambulatory Visit: Payer: Self-pay | Admitting: *Deleted

## 2015-06-05 NOTE — Telephone Encounter (Signed)
Requesting refill for cetirizine 10 mg

## 2015-06-06 ENCOUNTER — Ambulatory Visit (INDEPENDENT_AMBULATORY_CARE_PROVIDER_SITE_OTHER): Payer: Medicaid Other | Admitting: Psychology

## 2015-06-06 DIAGNOSIS — F330 Major depressive disorder, recurrent, mild: Secondary | ICD-10-CM

## 2015-06-06 NOTE — Progress Notes (Signed)
   THERAPIST PROGRESS NOTE  Session Time: 8.07am-8.49am  Participation Level: Active  Behavioral Response: Well GroomedAlert, affect WNL  Type of Therapy: Individual Therapy  Treatment Goals addressed: Diagnosis: MDD and goal 1.  Interventions: CBT and Supportive  Summary: Catiria Delagrange is a 20 y.o. female who presents with full and bright affect.  Pt reported that she is coping well w/ stress current.  Pt reported that she did withdraw from her bio class when didn't do well on exam following tutoring and extensive study.  Pt reports she is keeping a B in her other classes and feels good about progress and staying on top of things. Pt reported that she also has moved into her new apartment and this is going well. Pt discussed relationship w/ parents and feeling more supported and recognizes that she doesn't have to express her differing views on their decisions.  Pt also reported that she did inform employer not return for next school year and that this went better than expected.  Pt discussed plans for summer and looking towards employment or volunteering in medical field.   Suicidal/Homicidal: Nowithout intent/plan  Therapist Response: Assessed pt current functioning per pt report.  Processed w/pt her stressors and how she is coping.  Reflected to pt her her progress this semester and how related to her efforts and decisions.   Explored w/pt her interactions w/ family and w/ confronting fear of resignation and distortions that didn't get validated.  Reflected pt strengths of planning and identifying steps towards her goals.   Plan: Return again in 2 weeks.  Diagnosis: MDD   Jan Fireman, [facility] 06/06/2015

## 2015-06-07 MED ORDER — CETIRIZINE HCL 10 MG PO TABS: 10.0000 mg | ORAL_TABLET | Freq: Every day | ORAL | Status: DC

## 2015-06-07 NOTE — Telephone Encounter (Signed)
Cetirizine refilled.  Claudean Kinds, MD [place] [facility] [place], [place] [address] Ph: [phone] Fax: [phone] 06/07/2015 8:42 AM

## 2015-06-20 ENCOUNTER — Ambulatory Visit (HOSPITAL_COMMUNITY): Payer: Self-pay | Admitting: Psychology

## 2015-06-26 MED FILL — BUPROPION HCL XL 300 MG TAB: 300 | 30 days supply | Qty: 30 | Fill #1

## 2015-07-02 MED FILL — ?CETIRIZINE HCL 10 MG TABLE: 10 | 30 days supply | Qty: 31 | Fill #0

## 2015-07-04 ENCOUNTER — Ambulatory Visit (INDEPENDENT_AMBULATORY_CARE_PROVIDER_SITE_OTHER): Payer: Medicaid Other | Admitting: Psychology

## 2015-07-04 DIAGNOSIS — F411 Generalized anxiety disorder: Secondary | ICD-10-CM

## 2015-07-04 DIAGNOSIS — F330 Major depressive disorder, recurrent, mild: Secondary | ICD-10-CM

## 2015-07-04 NOTE — Progress Notes (Signed)
   THERAPIST PROGRESS NOTE  Session Time: 8.06am-8.53am  Participation Level: Active  Behavioral Response: Well GroomedAlertaffect wnL  Type of Therapy: Individual Therapy  Treatment Goals addressed: Diagnosis: GAd, MDD and goal 1.  Interventions: CBT and Solution Focused  Summary: Stacey Moyer is a 20 y.o. female who presents with generally full and bright affect.  Pt reported that she did have a period of being overwhelmed last month- hard to focus on what to do.  Pt reported that she did drop her biology class as realized wasn't going to pass the class. Pt reported that she is feeling good about the classes she has- 2 weeks left of school.  Pt discussed how she wants to plan appropriately for classes to complete her associates degree and plan for transfer to [employer].  Pt discussed once completes semester will focus on getting new job.  Pt discussed utilizing supports to meet her goals this coming 6 months.  Pt discussed negative self image that she has battled for a long time- and increased awareness of accepting her body type and focusing on building healthy life style habits.  Pt reports she is applying for her appeal to reinstated financial aid.   Suicidal/Homicidal: Nowithout intent/plan  Therapist Response: Assessed pt current functioning per pt report.  Processed w/ pt her mood and how she has managed w/ time management planning and reframing negative self talk.  Explored w/pt her transition w/ end of semester and plans moving forward w/ school and job.  Processed w/pt her self image- statements of self acceptance and not focusing on a number for her weight or measurements.  Plan: Return again in 2 weeks.  Diagnosis: MDD, GAD    Jan Fireman, LPC 07/04/2015

## 2015-07-09 ENCOUNTER — Ambulatory Visit (HOSPITAL_COMMUNITY): Payer: Self-pay | Admitting: Psychiatry

## 2015-07-25 ENCOUNTER — Encounter (HOSPITAL_COMMUNITY): Payer: Self-pay | Admitting: Psychology

## 2015-07-25 ENCOUNTER — Ambulatory Visit (HOSPITAL_COMMUNITY): Payer: Self-pay | Admitting: Psychology

## 2015-07-25 ENCOUNTER — Ambulatory Visit (INDEPENDENT_AMBULATORY_CARE_PROVIDER_SITE_OTHER): Payer: Medicaid Other | Admitting: Pediatrics

## 2015-07-25 ENCOUNTER — Encounter: Payer: Self-pay | Admitting: Pediatrics

## 2015-07-25 VITALS — Temp 97.2°F | Wt 144.2 lb

## 2015-07-25 DIAGNOSIS — N760 Acute vaginitis: Secondary | ICD-10-CM

## 2015-07-25 DIAGNOSIS — R300 Dysuria: Secondary | ICD-10-CM

## 2015-07-25 LAB — POCT URINALYSIS DIPSTICK
BILIRUBIN UA: NEGATIVE
GLUCOSE UA: NEGATIVE
Ketones, UA: NEGATIVE
Nitrite, UA: NEGATIVE
UROBILINOGEN UA: NEGATIVE
pH, UA: 7

## 2015-07-25 MED ORDER — FLUCONAZOLE 150 MG PO TABS: ORAL_TABLET | ORAL | Status: DC

## 2015-07-25 MED ORDER — FLUCONAZOLE 100 MG PO TABS: 150.0000 mg | ORAL_TABLET | ORAL | Status: DC

## 2015-07-25 MED ORDER — FLUCONAZOLE 100 MG PO TABS: 300.0000 mg | ORAL_TABLET | ORAL | Status: DC

## 2015-07-25 MED FILL — FLUCONAZOLE 150 MG TABLET: 150 | 28 days supply | Qty: 6 | Fill #0

## 2015-07-25 NOTE — Progress Notes (Signed)
Stacey Moyer is a 20 y.o. female patient who didn't show for her appointment. Letter sent        [place], LPC

## 2015-07-25 NOTE — Progress Notes (Signed)
History was provided by the patient.  Stacey Moyer is a 20 y.o. female who is here for vaginal discharge.     HPI:  Stacey Moyer is a 20 y.o. female with a history of mild persistent asthma, allergic rhinitis, eczema, dysmenorrhea, and prior yeast infection who presents with vaginal itching, pasty white/yellow vaginal discharge, and burning with urination for about a week. She is worried that she has a yeast infection because she has had several before with similar symptoms. Denies fever, abdominal pain, hematuria, dyspareunia. She is sexually active with 1 female partner for the last 4 years. No new partners. She has a Nexplanon for contraception. Does not use condoms. Last tested for GC/chlamydia 11/06/14 - negative. No history of STI or UTI.   Review of Systems  Constitutional: Negative for fever.  Gastrointestinal: Negative for vomiting, abdominal pain and blood in stool.  Genitourinary: Positive for dysuria and vaginal discharge. Negative for hematuria, vaginal bleeding, difficulty urinating, vaginal pain, pelvic pain and dyspareunia.    The following portions of the patient's history were reviewed and updated as appropriate: allergies, current medications, past family history, past medical history, past social history, past surgical history and problem list.  Physical Exam:  Temp(Src) 97.2 F (36.2 C)  Wt 144 lb 4 oz (65.431 kg)  LMP 06/20/2015  Patient's last menstrual period was 06/20/2015.    General:   alert, cooperative and no distress     Skin:   normal  Oral cavity:   lips, mucosa, and tongue normal; teeth and gums normal  Eyes:   sclerae white, pupils equal and reactive  Ears:   normal bilaterally  Nose: clear, no discharge  Neck:   supple  Lungs:  clear to auscultation bilaterally  Heart:   regular rate and rhythm, S1, S2 normal, no murmur, click, rub or gallop   Abdomen:  soft, non-tender; bowel sounds normal; no masses,  no organomegaly  GU:  thick white vaginal  discharge; no erythema or tenderness; pelvic exam not performed  Extremities:   extremities normal, atraumatic, no cyanosis or edema  Neuro:  normal without focal findings    Assessment/Plan: Stacey Moyer is a 20 y.o. female with a history of prior yeast infections who presents with vaginal itching and burning as well as white vaginal discharge. On exam she has thick white vaginal discharge, no erythema or lesions, no tenderness. Presentation consistent with likely vaginal candidiasis. Will prescribe a prolonged Diflucan treatment course due to recurrent infections despite treatment.   1. Dysuria - POCT urinalysis dipstick - GC/Chlamydia Probe Amp - WET PREP BY MOLECULAR PROBE - Ambulatory referral to Obstetrics / Gynecology to establish care  2. Vaginitis - fluconazole (DIFLUCAN) 150 MG tablet; fluconazole 150 mg every 72 hours for 3 doses (DAY 1, 4 & 7, followed by maintenance fluconazole 150 mg once per week for 6 months.  Dispense: 28 tablet; Refill: 0 - Patient would like to be updated with results on her personal cell # [phone]  Recommended transitioning to [facility] - she reports she is on a waiting list there.  Return if symptoms worsen or fail to improve.  Eldred Manges, MD  07/25/2015

## 2015-07-26 LAB — GC/CHLAMYDIA PROBE AMP
CT Probe RNA: NOT DETECTED
GC PROBE AMP APTIMA: NOT DETECTED

## 2015-07-26 LAB — WET PREP BY MOLECULAR PROBE
Candida species: POSITIVE — AB
Gardnerella vaginalis: NEGATIVE
TRICHOMONAS VAG: NEGATIVE

## 2015-07-30 MED FILL — ?CETIRIZINE HCL 10 MG TABLE: 10 | 30 days supply | Qty: 31 | Fill #1

## 2015-07-31 ENCOUNTER — Ambulatory Visit (HOSPITAL_COMMUNITY): Payer: Self-pay | Admitting: Psychiatry

## 2015-08-08 ENCOUNTER — Ambulatory Visit (INDEPENDENT_AMBULATORY_CARE_PROVIDER_SITE_OTHER): Payer: Medicaid Other | Admitting: Psychology

## 2015-08-08 DIAGNOSIS — F331 Major depressive disorder, recurrent, moderate: Secondary | ICD-10-CM

## 2015-08-08 NOTE — Progress Notes (Signed)
   THERAPIST PROGRESS NOTE  Session Time: 8.10am-8.55am  Participation Level: Active  Behavioral Response: Well GroomedAlertaffect WNL- talkative  Type of Therapy: Individual Therapy  Treatment Goals addressed: Diagnosis: MDD and goal 1  Interventions: CBT and Supportive  Summary: Stacey Moyer is a 20 y.o. female who presents with affect congruent w/ report of tired.  Pt was talkative in session. Pt reported that she completed her semester and passed classes w/ C or better.  Pt was bothered about struggle w/ math final as seemed to forget material and when looked over notes afterwards felt that she knew it.  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  Pt reported she applied to [employer] and is completing her financial aid.  Pt felt good that working w/ advisor who is spanish speaking and works w/ Radiation protection practitioner. Pt reported that she doesn't feel that her medication is working.  Pt reports feels easily irritable, pt reports no motivation, loss of interest, not doing anything except t.v. Or phone. Pt reported that also struggling a lot w/ forgetfulness, disorganization, focus.  Pt identified small steps towards increased activity and engagement and ways for reminders.  Suicidal/Homicidal: Nowithout intent/plan  Therapist Response: Assessed pt current functioning per pt report. Processed w/pt her end of semester, plans for summer and mood.  Discussed her mood and lack of energy and motivation.  Explored w/pt establishing daily self care and plans for small steps towards larger goals and ways to set reminders.  Discussed whether ADD symptoms and to discuss w/ psychiatrist as well.   Plan: Return again in 2 weeks.  Diagnosis: MDD,R/O ADD    Jan Fireman, [place] 08/08/2015

## 2015-08-09 ENCOUNTER — Encounter (HOSPITAL_COMMUNITY): Payer: Self-pay | Admitting: Psychiatry

## 2015-08-09 ENCOUNTER — Ambulatory Visit (INDEPENDENT_AMBULATORY_CARE_PROVIDER_SITE_OTHER): Payer: Medicaid Other | Admitting: Psychiatry

## 2015-08-09 VITALS — BP 112/64 | HR 64 | Ht 63.0 in | Wt 147.8 lb

## 2015-08-09 DIAGNOSIS — F330 Major depressive disorder, recurrent, mild: Secondary | ICD-10-CM

## 2015-08-09 MED ORDER — BUPROPION HCL ER (XL) 300 MG PO TB24: 300.0000 mg | ORAL_TABLET | ORAL | Status: DC

## 2015-08-09 MED FILL — BUPROPION HCL XL 300 MG TAB: 300 | 30 days supply | Qty: 30 | Fill #0

## 2015-08-09 NOTE — Progress Notes (Signed)
Stacey Moyer  Stacey Moyer [id number] 20 y.o.  08/09/2015 9:52 AM  Chief Complaint:  Sometime I feel medicine is not working.  I recently finished my exam.  I'm glad I past but it was very stressful.    History of Present Illness:  Stacey Moyer came for her follow-up appointment.  She is taking Wellbutrin XL 300 mg which was increased on her last visit.  She see improvement in the beginning but sometimes she feels it is not working very well.  But she also endorse increased stress in past few weeks.  She recently finished her school.  She was taking 5 classes.  She is also working in the school system and teaching second grade.  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  She admitted sometime feeling anxious and nervous.  Her attention and concentration is fair she is seeing Stacey Moyer.  She like to try a different medication however she scared that any other medication may cause weight gain and side effects.  Patient denies any paranoia, hallucination, suicidal thoughts or homicidal thoughts.  She is not involved in any self abusive behavior.  She has no tremors shakes or any EPS.  Overall she feels her attention and concentration is better.  Her irritability also get better.  She denies drinking alcohol or using any illegal substances.  Her appetite is okay.  Her vitals are stable.  Suicidal Ideation: No Plan Formed: No Patient has means to carry out plan: No  Homicidal Ideation: No Plan Formed: No Patient has means to carry out plan: No  Past Psychiatric History/Hospitalization(s): Patient has history of depression since eighth grade. She had tried antidepressant in the past but do not remember the details.  She had at least 3 psychiatric hospitalization.  She has history of cutting herself due to severe depression.  At age 71 she had first psychiatric hospitalization due to depression and cutting herself.   At age 45 she had taken overdose and require admission at behavioral [facility] and one year later at age 55 she is again admitted at old [facility] because of depression as she was declining grades at school.  [facility] until 2 years ago she turned 58 and she no longer have insurance.  Patient denies any history of mania, psychosis or any hallucination.  She denies any history of OCD or any PTSD symptoms.  As per chart she has taken Zoloft but patient do not remember the details. Anxiety: Yes Bipolar Disorder: No Depression: Yes Mania: No Psychosis: No Schizophrenia: No Personality Disorder: No Hospitalization for psychiatric illness: Yes History of Electroconvulsive Shock Therapy: No Prior Suicide Attempts: History of cutting herself and taking overdose in the past  Medical History; Patient has seasonal allergies and asthma.  She see pediatrician at [facility].  Patient denies any history of seizures.  Family History; Patient endorse brother and father has history of depression.  Review of Systems: Psychiatric: Agitation: No Hallucination: No Depressed Mood: Yes Insomnia: No Hypersomnia: No Altered Concentration: No Feels Worthless: No Grandiose Ideas: No Belief In Special Powers: No New/Increased Substance Abuse: No Compulsions: No  Neurologic: Headache: No Seizure: No Paresthesias: No   Outpatient Encounter Prescriptions as of 08/09/2015  Medication Sig  . albuterol (PROVENTIL HFA;VENTOLIN HFA) 108 (90 BASE) MCG/ACT inhaler Inhale 2 puffs into the lungs every 6 (six) hours as needed. For shortness of  breath  . beclomethasone (QVAR) 80 MCG/ACT inhaler Inhale 2 puffs into the lungs 2 (two) times daily.  Marland Kitchen buPROPion (WELLBUTRIN XL) 300 MG 24 hr tablet Take 1 tablet (300 mg total) by mouth every morning.  . cetirizine (ZYRTEC) 10 MG tablet Take 1 tablet (10 mg total) by mouth daily.  Marland Kitchen etonogestrel (NEXPLANON) 68 MG  IMPL implant 1 each by Subdermal route once.  . fluconazole (DIFLUCAN) 150 MG tablet fluconazole 150 mg every 72 hours for 3 doses (DAY 1, 4 & 7, followed by maintenance fluconazole 150 mg once per week for 6 months.  . fluticasone (FLONASE) 50 MCG/ACT nasal spray Place 2 sprays into both nostrils daily.  Marland Kitchen ibuprofen (ADVIL,MOTRIN) 600 MG tablet Take 1 tablet (600 mg total) by mouth every 6 (six) hours as needed. (Patient not taking: Reported on 01/28/2015)  . montelukast (SINGULAIR) 10 MG tablet Take 10 mg by mouth at bedtime. Reported on 07/25/2015  . triamcinolone ointment (KENALOG) 0.1 % Apply 1 application topically 2 (two) times daily. (Patient not taking: Reported on 07/25/2015)  . [DISCONTINUED] buPROPion (WELLBUTRIN XL) 300 MG 24 hr tablet Take 1 tablet (300 mg total) by mouth every morning.   No facility-administered encounter medications on file as of 08/09/2015.    Recent Results (from the past 2160 hour(s))  GC/Chlamydia Probe Amp     Status: None   Collection Time: 07/25/15 12:00 AM  Result Value Ref Range   CT Probe RNA NOT DETECTED     Comment:                    **Normal Reference Range: NOT DETECTED**   This test was performed using the APTIMA COMBO2 Assay ([place].).   The analytical performance characteristics of this assay, when used to test SurePath specimens have been determined by [facility]      GC Probe RNA NOT DETECTED     Comment:                    **Normal Reference Range: NOT DETECTED**   This test was performed using the APTIMA COMBO2 Assay ([place].).   The analytical performance characteristics of this assay, when used to test SurePath specimens have been determined by [facility]     Status: Abnormal   Collection Time: 07/25/15 12:00 AM  Result Value Ref Range   Candida species POS (A) Negative   Trichomonas vaginosis NEG Negative   Gardnerella vaginalis NEG Negative  POCT urinalysis  dipstick     Status: Abnormal   Collection Time: 07/25/15  9:25 AM  Result Value Ref Range   Color, UA yellow    Clarity, UA cloudy    Glucose, UA negative    Bilirubin, UA negative    Ketones, UA negative    Spec Grav, UA >=1.030    Blood, UA about 50    pH, UA 7.0    Protein, UA trace    Urobilinogen, UA negative    Nitrite, UA negative    Leukocytes, UA large (3+) (A) Negative      Constitutional:  BP 112/64 mmHg  Pulse 64  Ht 5\' 3"  (1.6 m)  Wt 147 lb 12.8 oz (67.042 kg)  BMI 26.19 kg/m2  LMP 06/20/2015   Musculoskeletal: Strength & Muscle Tone: within normal limits Gait & Station: normal Patient leans: N/A  Psychiatric Specialty Exam: General Appearance: Casual  Eye Contact::  Good  Speech:  Slow  Volume:  Normal  Mood:  Anxious  Affect:  Depressed  Thought Process:  Coherent and Intact  Orientation:  Full (Time, Place, and Person)  Thought Content:  Rumination  Suicidal Thoughts:  No  Homicidal Thoughts:  No  Memory:  Immediate;   Fair Recent;   Fair Remote;   Good  Judgement:  Good  Insight:  Good  Psychomotor Activity:  Normal  Concentration:  Fair  Recall:  [place] of Knowledge:  Good  Language:  Good  Akathisia:  No  Handed:  Right  AIMS (if indicated):     Assets:  Communication Skills Desire for Improvement Financial Resources/Insurance Housing Physical Health Social Support  ADL's:  Intact  Cognition:  WNL  Sleep:        Established Problem, Stable/Improving (1), Review of Psycho-Social Stressors (1), Review of Last Therapy Session (1) and Review of Medication Regimen & Side Effects (2)  Assessment: Axis I: Major depressive disorder, recurrent mild without psychosis  Axis II: Deferred  Axis III:  Past Medical History  Diagnosis Date  . Asthma   . Depression   . Anxiety   . Seasonal allergies      Plan:  I discuss side effects of antidepressant.  She recently finished her school and her stress level is coming down.   She wants to continue Wellbutrin XL 300 for now she like to follow-up in 6 weeks to see if she continued to feel depressed and we will consider switching to a different antidepressant.  We discussed other alternative will be a Prozac or Cymbalta.  She had tried Zoloft but do not remember the details.  She will see Stacey Moyer for counseling.  Recommended to call us back if she has any question, concern if he feel worsening of the symptom.  Follow-up in 6 weeks.  Mingo Siegert T., MD 08/09/2015

## 2015-08-22 ENCOUNTER — Encounter (HOSPITAL_COMMUNITY): Payer: Self-pay | Admitting: Psychology

## 2015-08-22 ENCOUNTER — Ambulatory Visit (HOSPITAL_COMMUNITY): Payer: Self-pay | Admitting: Psychology

## 2015-08-22 NOTE — Progress Notes (Signed)
Stacey Moyer is a 20 y.o. female patient who didn't show for her appointment.  Letter sent.        Jan Fireman, LPC

## 2015-09-03 ENCOUNTER — Ambulatory Visit (HOSPITAL_COMMUNITY): Payer: Self-pay | Admitting: Psychology

## 2015-09-04 ENCOUNTER — Encounter: Payer: Self-pay | Admitting: Obstetrics and Gynecology

## 2015-09-16 ENCOUNTER — Telehealth: Payer: Self-pay | Admitting: Pediatrics

## 2015-09-16 NOTE — Telephone Encounter (Signed)
Pt called stating that there is an issue with [employer] and would like for Barbie to call back at [phone].

## 2015-09-18 ENCOUNTER — Ambulatory Visit (INDEPENDENT_AMBULATORY_CARE_PROVIDER_SITE_OTHER): Payer: Medicaid Other | Admitting: Psychology

## 2015-09-18 DIAGNOSIS — F330 Major depressive disorder, recurrent, mild: Secondary | ICD-10-CM

## 2015-09-18 MED FILL — ?CETIRIZINE HCL 10 MG TABLE: 10 | 30 days supply | Qty: 31 | Fill #2

## 2015-09-18 MED FILL — FLUCONAZOLE 150 MG TABLET: 150 | 28 days supply | Qty: 6 | Fill #1

## 2015-09-18 NOTE — Progress Notes (Signed)
   THERAPIST PROGRESS NOTE  Session Time: 8.05am-8.55am  Participation Level: Active  Behavioral Response: Well GroomedAlertaffect congruent  Type of Therapy: Individual Therapy  Treatment Goals addressed: Diagnosis: MDD and goal 1  Interventions: CBT and Supportive  Summary: Stacey Moyer is a 20 y.o. female who presents with generally bright affect- at times teary when discussing stressors and conflict w/ mom.  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  pt reports she is keeping in good standing w/ [employer] so that can work as needed with this employer next school year.  Pt reported that her Erling Cruz is incomplete as high school never sent transcripts so addressing today.  Pt reported that she had a great vacation w/her boyfriend and he proposed to her on the trip.  Pt is excited about this but is also feeling stressed as her parents aren't supportive although they gave permission for him to propose.  Pt reports that feels that they are basing on their relationship 4 years ago and at the time when there was a lot of conflict between pt and parents.  Pt reports that she feels that they have grown in their relationship not the immature relationship that had 4 years ago.  Pt wants parents to be open to relationship and not judgmental towards her decisions. Pt aware that can't control this and ways to facilitate better communication.   Suicidal/Homicidal: Nowithout intent/plan  Therapist Response: Assessed pt current functioning per pt report.  Explored w/pt her employment plans and transfer plans for this fall.  Processed w/ pt her feeling re: engagment, tension w/ parents and how impacted by parent-child relationship.  Explored w/pt her relationship w/ fiance.  Discussed conflict resolution.    Plan: Return again in 2 weeks.  Diagnosis: MDD    Jan Fireman, [facility] 09/18/2015

## 2015-10-02 ENCOUNTER — Ambulatory Visit (HOSPITAL_COMMUNITY): Payer: Self-pay | Admitting: Psychology

## 2015-10-08 ENCOUNTER — Ambulatory Visit (HOSPITAL_COMMUNITY): Payer: Self-pay | Admitting: Psychiatry

## 2015-10-09 ENCOUNTER — Encounter (HOSPITAL_COMMUNITY): Payer: Self-pay | Admitting: Psychology

## 2015-10-09 ENCOUNTER — Ambulatory Visit (HOSPITAL_COMMUNITY): Payer: Self-pay | Admitting: Psychology

## 2015-10-09 NOTE — Progress Notes (Signed)
Stacey Moyer is a 20 y.o. female patient who called and cancelled appointment for today.  Pt is scheduled 10/16/15 for next appointment.        Jan Fireman, LPC

## 2015-10-16 ENCOUNTER — Ambulatory Visit (HOSPITAL_COMMUNITY): Payer: Self-pay | Admitting: Psychology

## 2015-10-16 ENCOUNTER — Encounter (HOSPITAL_COMMUNITY): Payer: Self-pay | Admitting: Psychology

## 2015-10-16 NOTE — Progress Notes (Signed)
Stacey Moyer is a 20 y.o. female patient who didn't show for her appointment.  Letter sent.        Jan Fireman, LPC

## 2015-10-18 MED FILL — ?BUPROPION HCL XL 300 MG TA: 300 | 30 days supply | Qty: 30 | Fill #1

## 2015-10-18 MED FILL — FLUCONAZOLE 150 MG TABLET: 150 | 28 days supply | Qty: 6 | Fill #2

## 2015-10-18 MED FILL — ?CETIRIZINE HCL 10 MG TABLE: 10 | 30 days supply | Qty: 31 | Fill #3

## 2015-12-03 ENCOUNTER — Ambulatory Visit (HOSPITAL_COMMUNITY): Payer: Self-pay | Admitting: Psychiatry

## 2015-12-07 IMAGING — DX DG RIBS W/ CHEST 3+V*R*
3 series · 3 of 3 positions shown · non-contrast
Comparison: Chest x-ray from 11/27/2012.

CLINICAL DATA: Initial encounter for slip and fall onto table today
with right lateral rib pain.

EXAM:
RIGHT RIBS AND CHEST - 3+ VIEW

[chest pa]
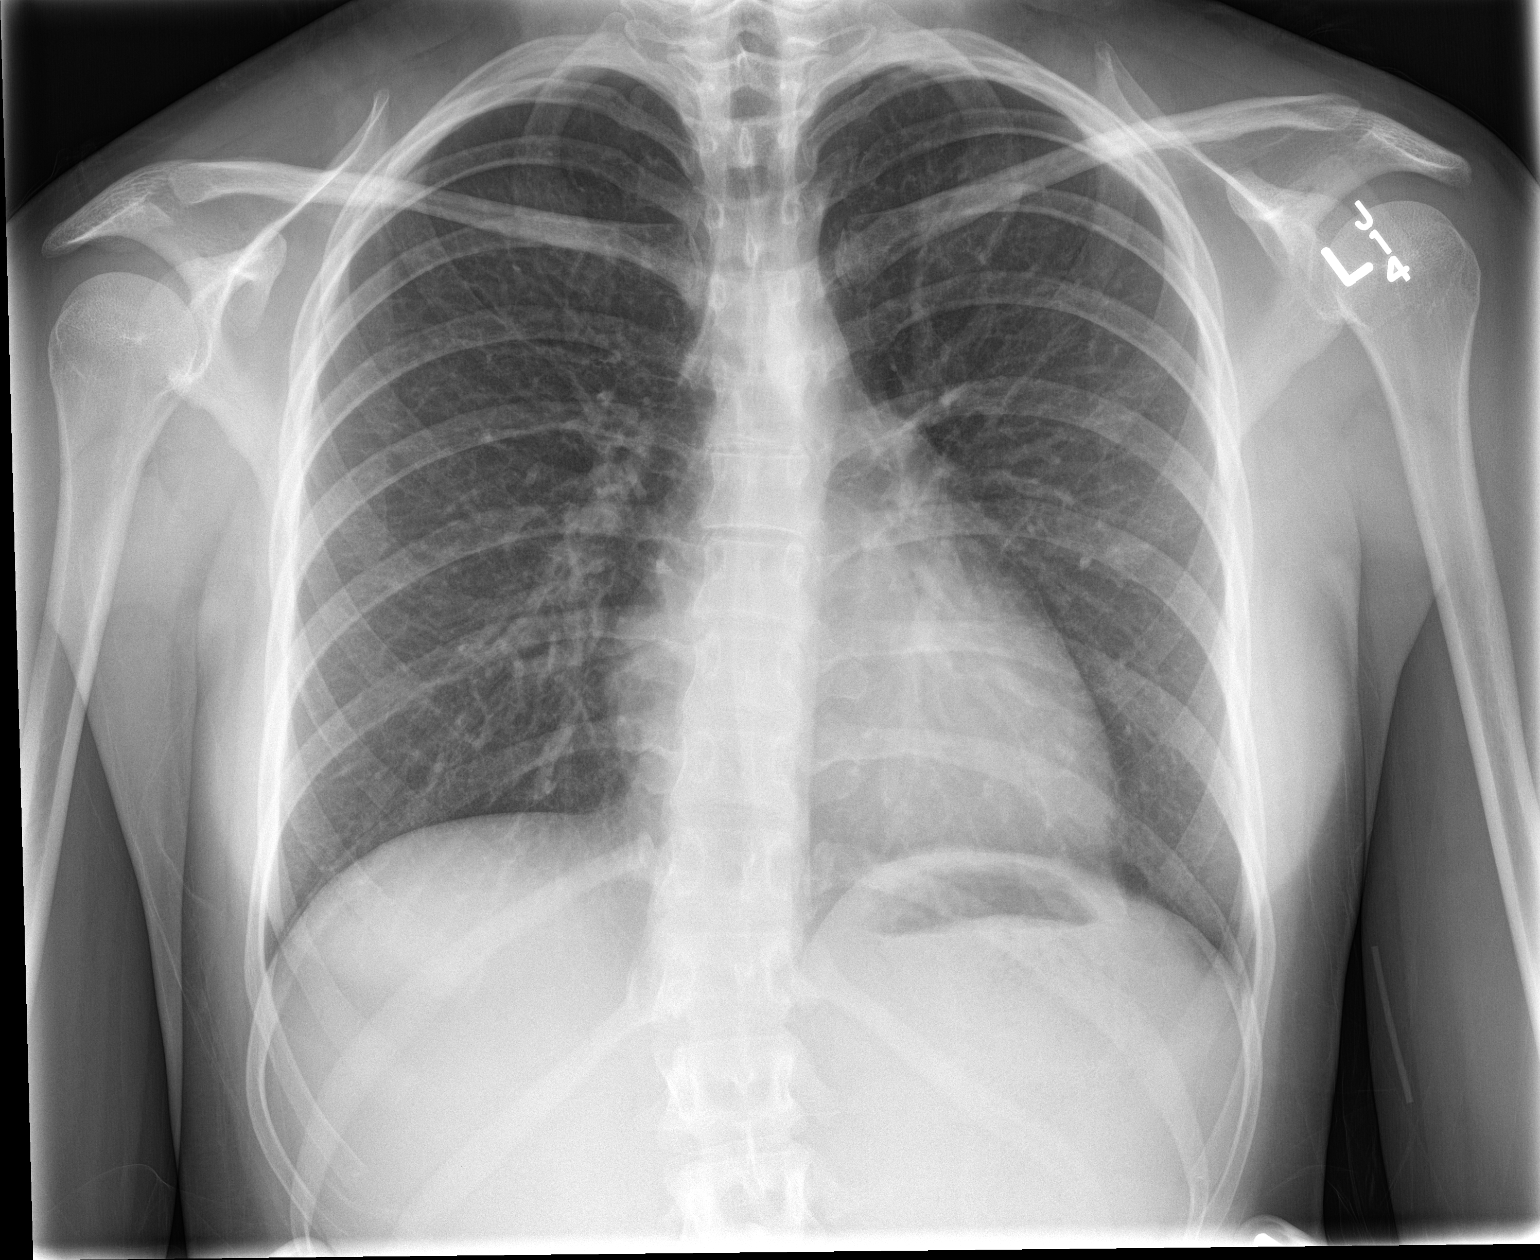

[rib ap]
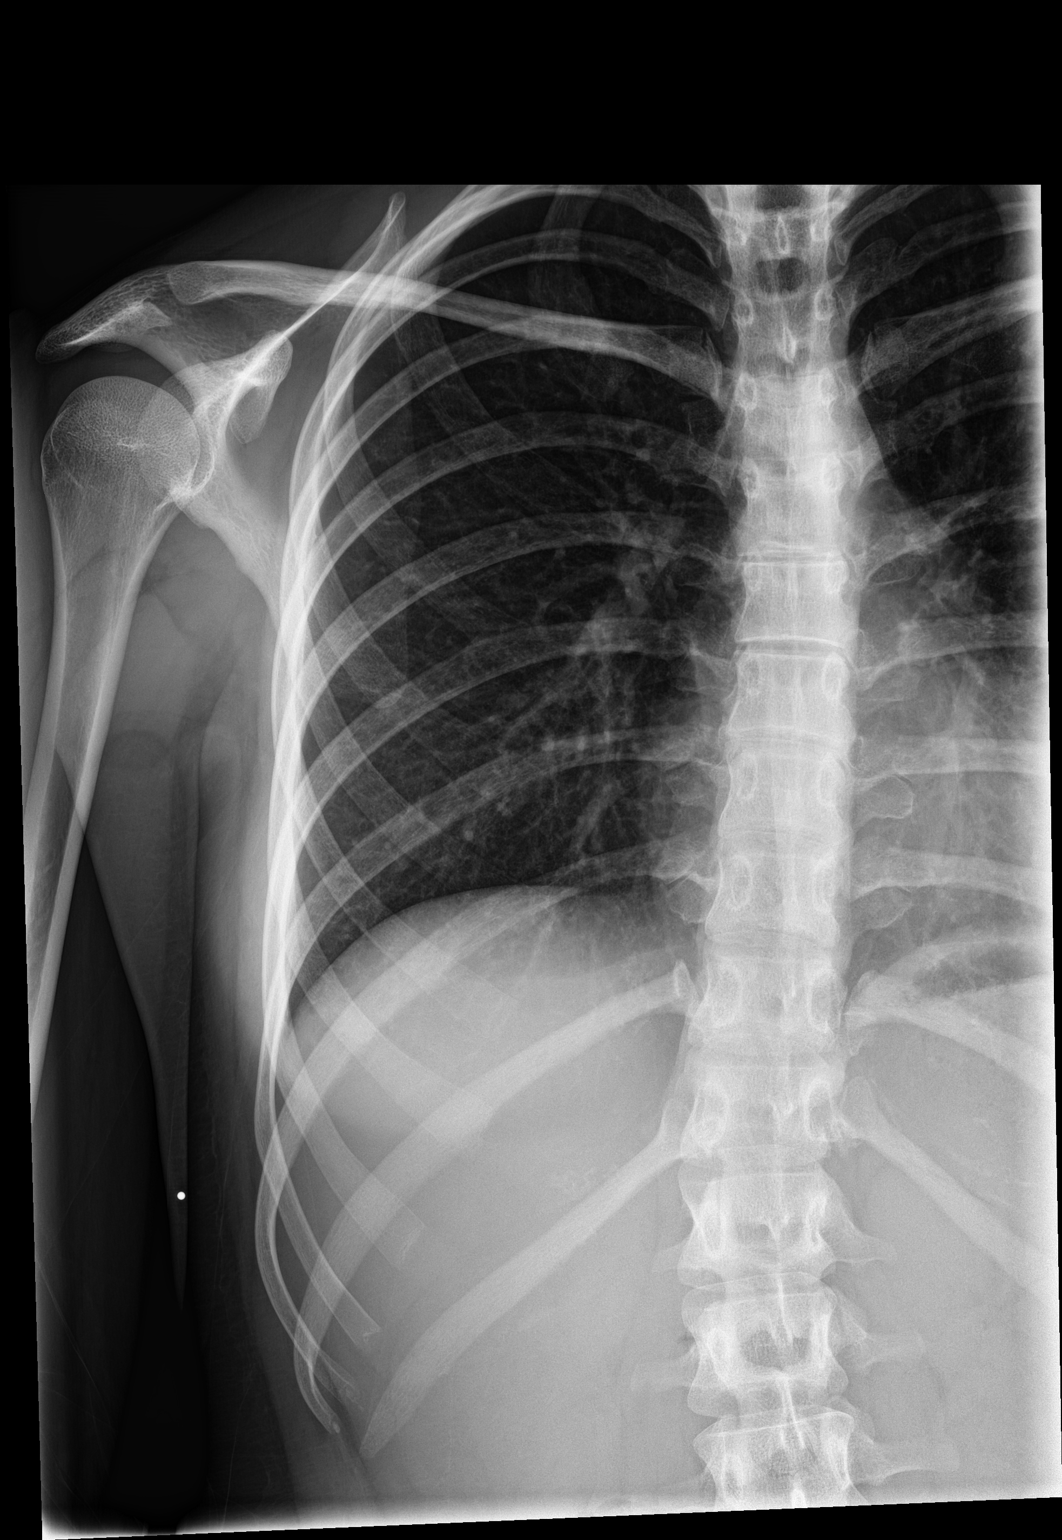

[rib obl]
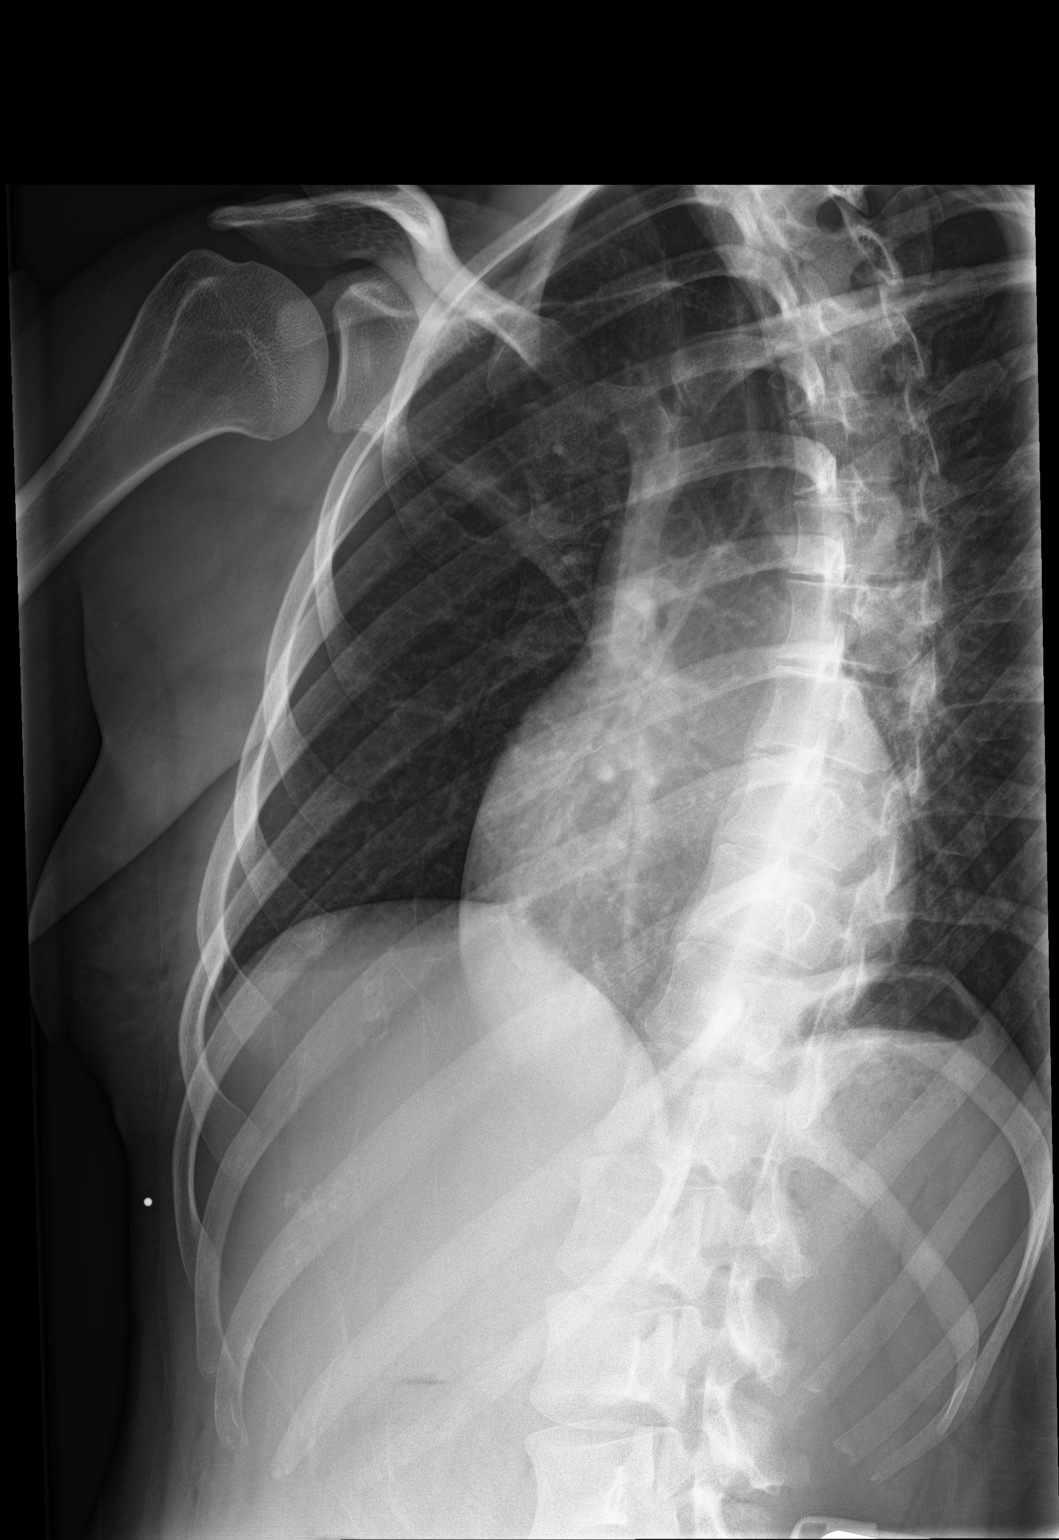

[3 of 3 positions shown; findings below may reference images not displayed]

FINDINGS: The lungs are clear without focal consolidation, edema, effusion or
pneumothorax. Cardio pericardial silhouette is within normal limits
for size. Imaged bony structures of the thorax are intact.

Oblique views of the right ribs were obtained with a radiopaque BB
localizing the area of patient concern. No underlying acute right
rib fracture.
IMPRESSION: Negative.

## 2015-12-10 ENCOUNTER — Encounter (HOSPITAL_COMMUNITY): Payer: Self-pay | Admitting: Psychology

## 2015-12-10 ENCOUNTER — Ambulatory Visit (HOSPITAL_COMMUNITY): Payer: Self-pay | Admitting: Psychology

## 2015-12-10 NOTE — Progress Notes (Signed)
Stacey Moyer is a 20 y.o. female patient who didn't show for her appointment.  Letter sent.        Jan Fireman, LPC

## 2015-12-23 ENCOUNTER — Other Ambulatory Visit (HOSPITAL_COMMUNITY): Payer: Self-pay | Admitting: Psychiatry

## 2015-12-23 DIAGNOSIS — F330 Major depressive disorder, recurrent, mild: Secondary | ICD-10-CM

## 2015-12-24 MED FILL — ?BUPROPION HCL XL 300 MG TA: 300 | 30 days supply | Qty: 30 | Fill #0

## 2016-01-06 MED FILL — FLUCONAZOLE 150 MG TABLET: 150 | 28 days supply | Qty: 6 | Fill #3

## 2016-02-04 ENCOUNTER — Ambulatory Visit (HOSPITAL_COMMUNITY): Payer: Self-pay | Admitting: Psychiatry

## 2016-02-10 ENCOUNTER — Other Ambulatory Visit (HOSPITAL_COMMUNITY): Payer: Self-pay | Admitting: Psychiatry

## 2016-02-10 DIAGNOSIS — F330 Major depressive disorder, recurrent, mild: Secondary | ICD-10-CM

## 2016-02-17 ENCOUNTER — Other Ambulatory Visit (HOSPITAL_COMMUNITY): Payer: Self-pay | Admitting: Psychiatry

## 2016-02-19 ENCOUNTER — Ambulatory Visit (HOSPITAL_COMMUNITY)
Admission: EM | Admit: 2016-02-19 | Discharge: 2016-02-19 | Disposition: A | Discharge status: Home / Self Care | Payer: No Typology Code available for payment source | Attending: Emergency Medicine | Admitting: Emergency Medicine

## 2016-02-19 ENCOUNTER — Encounter (HOSPITAL_COMMUNITY): Payer: Self-pay | Admitting: Emergency Medicine

## 2016-02-19 DIAGNOSIS — J40 Bronchitis, not specified as acute or chronic: Secondary | ICD-10-CM

## 2016-02-19 DIAGNOSIS — J069 Acute upper respiratory infection, unspecified: Secondary | ICD-10-CM | POA: Diagnosis not present

## 2016-02-19 DIAGNOSIS — J4531 Mild persistent asthma with (acute) exacerbation: Secondary | ICD-10-CM

## 2016-02-19 DIAGNOSIS — B9789 Other viral agents as the cause of diseases classified elsewhere: Secondary | ICD-10-CM | POA: Diagnosis not present

## 2016-02-19 MED ORDER — PREDNISONE 50 MG PO TABS: ORAL_TABLET | ORAL | 0 refills | Status: DC

## 2016-02-19 MED ORDER — ALBUTEROL SULFATE HFA 108 (90 BASE) MCG/ACT IN AERS: 2.0000 | INHALATION_SPRAY | RESPIRATORY_TRACT | 1 refills | Status: AC | PRN

## 2016-02-19 MED ORDER — BENZONATATE 100 MG PO CAPS: 100.0000 mg | ORAL_CAPSULE | Freq: Three times a day (TID) | ORAL | 0 refills | Status: DC

## 2016-02-19 MED ORDER — AZITHROMYCIN 250 MG PO TABS: ORAL_TABLET | ORAL | 0 refills | Status: DC

## 2016-02-19 MED ORDER — METHYLPREDNISOLONE ACETATE 80 MG/ML IJ SUSP
INTRAMUSCULAR | Status: AC
  Filled 2016-02-19: qty 1

## 2016-02-19 MED ORDER — METHYLPREDNISOLONE ACETATE 80 MG/ML IJ SUSP
80.0000 mg | Freq: Once | INTRAMUSCULAR | Status: AC
  Administered 2016-02-19: 80 mg via INTRAMUSCULAR

## 2016-02-19 NOTE — ED Triage Notes (Signed)
The patient presented to the [facility] with multiple complaints.  The patient reported that she has had a productive cough with chest wall pain for 3 weeks.  The patient also reported bilateral eye irritation that started yesterday.

## 2016-02-19 NOTE — ED Provider Notes (Signed)
[facility]    CSN: [id number] Arrival date & time: 02/19/16  1631     History   Chief Complaint Chief Complaint  Patient presents with  . Cough    HPI Stacey Moyer is a 20 y.o. female.   HPI  She is a 20 year old woman here for evaluation of cough. Her symptoms started about 3 weeks ago with nasal congestion, runny nose, sore throat, and cough. They've gradually been worsening. She reports feeling short of breath, particularly at night. She has heard some intermittent wheezing. She has also had some intermittent ear pain. She has also had body aches. She did have a reported fever yesterday that responded to Advil.  She does have asthma and has been using her Qvar twice a day. She does not have any albuterol.  Past Medical History:  Diagnosis Date  . Anxiety   . Asthma   . Depression   . Seasonal allergies     Patient Active Problem List   Diagnosis Date Noted  . Yeast infection 04/27/2015  . Eczema 01/28/2015  . Weight loss 05/03/2014  . Postinflammatory hypopigmentation 02/13/2014  . Dysmenorrhea 02/13/2014  . Patellar subluxation 01/15/2014  . Presence of subdermal contraceptive implant 02/15/2013  . Allergic rhinitis 09/19/2012  . Mild persistent asthma 09/19/2012    Past Surgical History:  Procedure Laterality Date  . TOOTH EXTRACTION      OB History    No data available       Home Medications    Prior to Admission medications   Medication Sig Start Date End Date Taking? Authorizing Provider  beclomethasone (QVAR) 80 MCG/ACT inhaler Inhale 2 puffs into the lungs 2 (two) times daily. 01/28/15  Yes Janell Quiet, MD  cetirizine (ZYRTEC) 10 MG tablet Take 1 tablet (10 mg total) by mouth daily. 06/07/15  Yes Ok Edwards, MD  etonogestrel (NEXPLANON) 68 MG IMPL implant 1 each by Subdermal route once.   Yes Historical Provider, MD  albuterol (PROVENTIL HFA;VENTOLIN HFA) 108 (90 Base) MCG/ACT inhaler Inhale 2 puffs into the lungs every 4  (four) hours as needed. For shortness of breath 02/19/16   Melony Overly, MD  azithromycin (ZITHROMAX Z-PAK) 250 MG tablet Take 2 pills today, then 1 pill daily until gone. 02/19/16   Melony Overly, MD  benzonatate (TESSALON) 100 MG capsule Take 1 capsule (100 mg total) by mouth every 8 (eight) hours. 02/19/16   Melony Overly, MD  buPROPion (WELLBUTRIN XL) 300 MG 24 hr tablet TAKE 1 TABLET BY MOUTH EVERY MORNING. 12/24/15   Kathlee Nations, MD  fluconazole (DIFLUCAN) 150 MG tablet fluconazole 150 mg every 72 hours for 3 doses (DAY 1, 4 & 7, followed by maintenance fluconazole 150 mg once per week for 6 months. 07/25/15   Ok Edwards, MD  fluticasone (FLONASE) 50 MCG/ACT nasal spray Place 2 sprays into both nostrils daily. 01/28/15   Janell Quiet, MD  ibuprofen (ADVIL,MOTRIN) 600 MG tablet Take 1 tablet (600 mg total) by mouth every 6 (six) hours as needed. Patient not taking: Reported on 01/28/2015 06/27/14   Melony Overly, MD  montelukast (SINGULAIR) 10 MG tablet Take 10 mg by mouth at bedtime. Reported on 07/25/2015    Historical Provider, MD  predniSONE (DELTASONE) 50 MG tablet Take 1 pill daily for 5 days. 02/19/16   Melony Overly, MD  triamcinolone ointment (KENALOG) 0.1 % Apply 1 application topically 2 (two) times daily. Patient not taking: Reported on 07/25/2015 01/28/15  Janell Quiet, MD    Family History Family History  Problem Relation Age of Onset  . Asthma Brother   . Depression Brother   . ADD / ADHD Brother   . Cancer Maternal Uncle   . Depression Father   . Thyroid disease Mother     Social History Social History  Substance Use Topics  . Smoking status: Never Smoker  . Smokeless tobacco: Never Used  . Alcohol use No     Allergies   Penicillins   Review of Systems Review of Systems As in history of present illness  Physical Exam Triage Vital Signs ED Triage Vitals  Enc Vitals Group     BP 02/19/16 1654 115/79     Pulse Rate 02/19/16 1654 66     Resp  02/19/16 1654 16     Temp 02/19/16 1654 98.5 F (36.9 C)     Temp Source 02/19/16 1654 Oral     SpO2 02/19/16 1654 100 %     Weight --      Height --      Head Circumference --      Peak Flow --      Pain Score 02/19/16 1658 5     Pain Loc --      Pain Edu? --      Excl. in [place]? --    No data found.   Updated Vital Signs BP 115/79 (BP Location: Left Arm)   Pulse 66   Temp 98.5 F (36.9 C) (Oral)   Resp 16   SpO2 100%   Visual Acuity Right Eye Distance:   Left Eye Distance:   Bilateral Distance:    Right Eye Near:   Left Eye Near:    Bilateral Near:     Physical Exam  Constitutional: She is oriented to person, place, and time. She appears well-developed and well-nourished. No distress.  HENT:  TMs normal bilaterally. Nasal mucosa is erythematous and boggy. Oropharynx is clear. There does appear to be several healing ulcerations on the uvula.  Neck: Neck supple.  Cardiovascular: Normal rate, regular rhythm and normal heart sounds.   No murmur heard. Pulmonary/Chest: Effort normal. No respiratory distress. She has no wheezes. She has no rales.  Breath sounds are coarse with cough.  Lymphadenopathy:    She has no cervical adenopathy.  Neurological: She is alert and oriented to person, place, and time.     UC Treatments / Results  Labs (all labs ordered are listed, but only abnormal results are displayed) Labs Reviewed - No data to display  EKG  EKG Interpretation None       Radiology No results found.  Procedures Procedures (including critical care time)  Medications Ordered in UC Medications  methylPREDNISolone acetate (DEPO-MEDROL) injection 80 mg (80 mg Intramuscular Given 02/19/16 1732)     Initial Impression / Assessment and Plan / UC Course  I have reviewed the triage vital signs and the nursing notes.  Pertinent labs & imaging results that were available during my care of the patient were reviewed by me and considered in my medical decision  making (see chart for details).  Clinical Course     Concern for developing bronchitis. Treat with azithromycin and prednisone. Tessalon as needed for cough. Prescription sent for albuterol inhaler. As she will not be able to pick up his medicines until tomorrow, will get a Depo-Medrol injection here.  Final Clinical Impressions(s) / UC Diagnoses   Final diagnoses:  Viral URI with cough  Bronchitis    New Prescriptions New Prescriptions   AZITHROMYCIN (ZITHROMAX Z-PAK) 250 MG TABLET    Take 2 pills today, then 1 pill daily until gone.   BENZONATATE (TESSALON) 100 MG CAPSULE    Take 1 capsule (100 mg total) by mouth every 8 (eight) hours.   PREDNISONE (DELTASONE) 50 MG TABLET    Take 1 pill daily for 5 days.     Melony Overly, MD 02/19/16 [phone]

## 2016-02-19 NOTE — Discharge Instructions (Signed)
I'm concerned that you are developing bronchitis. Take azithromycin and prednisone as prescribed. Use Tessalon as needed for cough. Use the albuterol inhaler every 4 hours as needed. Things should start to improve within the next 2-3 days. Follow-up as needed.

## 2016-02-20 ENCOUNTER — Other Ambulatory Visit (HOSPITAL_COMMUNITY): Payer: Self-pay | Admitting: Psychiatry

## 2016-02-20 DIAGNOSIS — F330 Major depressive disorder, recurrent, mild: Secondary | ICD-10-CM

## 2016-02-20 MED FILL — BENZONATATE 100 MG CAPSULE: 100 | 7 days supply | Qty: 21 | Fill #0

## 2016-02-20 MED FILL — VENTOLIN HFA 90 MCG INHALER: 108 (90 BAS | 25 days supply | Qty: 18 | Fill #0

## 2016-02-20 MED FILL — ?AZITHROMYCIN 250 MG TABLET: 250 | 5 days supply | Qty: 6 | Fill #0

## 2016-02-21 NOTE — Telephone Encounter (Signed)
Met with Dr. Arfeen who approved a one time order for patient's prescribed Wellbutrin XL 300 mg.  New order e-scribed as approved by Dr. Arfeen with notation patient would need to schedule an evaluation for any further refills.  

## 2016-02-25 ENCOUNTER — Ambulatory Visit (HOSPITAL_COMMUNITY): Payer: Self-pay | Admitting: Psychology

## 2016-02-25 ENCOUNTER — Encounter (HOSPITAL_COMMUNITY): Payer: Self-pay | Admitting: Psychology

## 2016-02-25 NOTE — Progress Notes (Signed)
Stacey Moyer is a 20 y.o. female patient who didn't show for her appointment.  Letter sent.        Jan Fireman, LPC

## 2016-03-11 MED FILL — ?BUPROPION HCL XL 300 MG TA: 300 | 30 days supply | Qty: 30 | Fill #0

## 2016-03-11 MED FILL — ?PREDNISONE 10 MG TABLET: 10 | 5 days supply | Qty: 25 | Fill #0

## 2016-03-11 MED FILL — ?CETIRIZINE HCL 10 MG TABLE: 10 | 30 days supply | Qty: 31 | Fill #4

## 2016-04-16 ENCOUNTER — Encounter (HOSPITAL_COMMUNITY): Payer: Self-pay | Admitting: Psychology

## 2016-04-23 MED FILL — ?CETIRIZINE HCL 10 MG TABLE: 10 | 30 days supply | Qty: 31 | Fill #5

## 2016-04-23 MED FILL — FLUCONAZOLE 150 MG TABLET: 150 | 18 days supply | Qty: 4 | Fill #4

## 2016-05-06 ENCOUNTER — Encounter (HOSPITAL_COMMUNITY): Payer: Self-pay | Admitting: Psychology

## 2016-05-06 NOTE — Progress Notes (Signed)
Stacey Moyer is a 21 y.o. female patient who is discharged from counseling as last seen on 09/18/15 and had no shows since .  Outpatient Therapist Discharge Summary  Stacey Moyer    Aug 13, 1995   Admission Date: 03/13/15   Discharge Date:  05/06/16 Reason for Discharge:  Not active Diagnosis:  MDD, mild at last session.  Comments:  Return as needed  Stacey Moyer, [facility]

## 2016-06-16 MED FILL — ?BUPROPION HCL XL 150 MG TA: 150 | 30 days supply | Qty: 30 | Fill #0

## 2016-07-13 MED FILL — ?BUPROPION HCL XL 150 MG TA: 150 | 30 days supply | Qty: 90 | Fill #0

## 2016-07-13 MED FILL — HYDROXYZINE PAM 25 MG CAP: 25 | 30 days supply | Qty: 30 | Fill #0

## 2016-09-09 MED FILL — ?BUPROPION HCL XL 150 MG TA: 150 | 30 days supply | Qty: 30 | Fill #0

## 2016-09-30 MED FILL — ?BUPROPION HCL XL 150 MG TA: 150 | 30 days supply | Qty: 30 | Fill #0

## 2016-10-12 ENCOUNTER — Other Ambulatory Visit (HOSPITAL_COMMUNITY): Payer: Self-pay | Admitting: *Deleted

## 2016-10-12 DIAGNOSIS — N6320 Unspecified lump in the left breast, unspecified quadrant: Secondary | ICD-10-CM

## 2016-10-22 MED FILL — ?BUPROPION HCL XL 150 MG TA: 150 | 30 days supply | Qty: 90 | Fill #0

## 2016-10-29 ENCOUNTER — Ambulatory Visit (HOSPITAL_COMMUNITY)
Admission: RE | Admit: 2016-10-29 | Discharge: 2016-10-29 | Disposition: A | Discharge status: Home / Self Care | Payer: Self-pay | Source: Ambulatory Visit | Attending: Obstetrics and Gynecology | Admitting: Obstetrics and Gynecology

## 2016-10-29 ENCOUNTER — Other Ambulatory Visit (HOSPITAL_COMMUNITY): Payer: Self-pay | Admitting: Obstetrics and Gynecology

## 2016-10-29 ENCOUNTER — Encounter (HOSPITAL_COMMUNITY): Payer: Self-pay

## 2016-10-29 ENCOUNTER — Ambulatory Visit
Admission: RE | Admit: 2016-10-29 | Discharge: 2016-10-29 | Disposition: A | Discharge status: Home / Self Care | Payer: No Typology Code available for payment source | Source: Ambulatory Visit | Attending: Obstetrics and Gynecology | Admitting: Obstetrics and Gynecology

## 2016-10-29 VITALS — BP 117/67 | HR 70 | Temp 98.4°F | Ht 63.0 in | Wt 149.4 lb

## 2016-10-29 DIAGNOSIS — N6320 Unspecified lump in the left breast, unspecified quadrant: Secondary | ICD-10-CM

## 2016-10-29 DIAGNOSIS — N644 Mastodynia: Secondary | ICD-10-CM

## 2016-10-29 DIAGNOSIS — N6325 Unspecified lump in the left breast, overlapping quadrants: Secondary | ICD-10-CM

## 2016-10-29 DIAGNOSIS — D242 Benign neoplasm of left breast: Secondary | ICD-10-CM

## 2016-10-29 DIAGNOSIS — Z1239 Encounter for other screening for malignant neoplasm of breast: Secondary | ICD-10-CM

## 2016-10-29 NOTE — Patient Instructions (Signed)
Explained breast self awareness with Stacey Moyer. Patient did not need a Pap smear today due to last Pap smear was 07/07/2016. Let her know [facility] will cover Pap smears every 3 years unless has a history of abnormal Pap smears. Referred patient to the [facility] for a left breast ultrasound. Appointment scheduled for Thursday, October 29, 2016 at 1530.  Stacey Moyer verbalized understanding.  Shondrea Steinert, [place], RN 2:18 PM

## 2016-10-29 NOTE — Progress Notes (Signed)
Complaints of left breast lump x 2 months that is painful at times. Patient rates the pain at a 4-6 out of 10.  Pap Smear: Pap smear not completed today. Last Pap smear was 07/07/2016 at the [facility] and normal. Per patient has no history of an abnormal Pap smear. Last Pap smear result is in EPIC.  Physical exam: Breasts Breasts symmetrical. No skin abnormalities bilateral breasts. No nipple retraction bilateral breasts. No nipple discharge bilateral breasts. No lymphadenopathy. No lumps palpated right breast. Palpated a mobile lump within the left breast at 3 o'clock 2 cm from the nipple. Complaints of tenderness when palpated lump within the left breast. Referred patient to the [facility] for a left breast ultrasound. Appointment scheduled for Thursday, October 29, 2016 at 1530.        Pelvic/Bimanual No Pap smear completed today since last Pap smear was 07/07/2016. Pap smear not indicated per [facility]CP guidelines.   Smoking History: Patient has never smoked.  Patient Navigation: Patient education provided. Access to services provided for patient through [facility] program.

## 2016-12-17 MED FILL — ?BUPROPION HCL XL 150 MG TA: 150 | 30 days supply | Qty: 90 | Fill #0

## 2017-02-12 MED FILL — BUPROPION HCL XL 150 MG TAB: 150 | 30 days supply | Qty: 90 | Fill #0

## 2017-04-06 MED FILL — BUPROPION HCL XL 150 MG TAB: 150 | 30 days supply | Qty: 90 | Fill #1

## 2017-04-26 ENCOUNTER — Emergency Department (HOSPITAL_COMMUNITY): Payer: BLUE CROSS/BLUE SHIELD

## 2017-04-26 ENCOUNTER — Emergency Department (HOSPITAL_COMMUNITY)
Admission: EM | Admit: 2017-04-26 | Discharge: 2017-04-26 | Disposition: A | Discharge status: Home / Self Care | Payer: BLUE CROSS/BLUE SHIELD | Attending: Emergency Medicine | Admitting: Emergency Medicine

## 2017-04-26 ENCOUNTER — Other Ambulatory Visit: Payer: Self-pay

## 2017-04-26 ENCOUNTER — Encounter (HOSPITAL_COMMUNITY): Payer: Self-pay

## 2017-04-26 ENCOUNTER — Ambulatory Visit (HOSPITAL_COMMUNITY)
Admission: EM | Admit: 2017-04-26 | Discharge: 2017-04-26 | Disposition: A | Discharge status: Home / Self Care | Payer: BLUE CROSS/BLUE SHIELD | Source: Home / Self Care

## 2017-04-26 DIAGNOSIS — Z79899 Other long term (current) drug therapy: Secondary | ICD-10-CM | POA: Insufficient documentation

## 2017-04-26 DIAGNOSIS — R109 Unspecified abdominal pain: Secondary | ICD-10-CM

## 2017-04-26 DIAGNOSIS — R1011 Right upper quadrant pain: Secondary | ICD-10-CM | POA: Diagnosis not present

## 2017-04-26 DIAGNOSIS — J4530 Mild persistent asthma, uncomplicated: Secondary | ICD-10-CM | POA: Insufficient documentation

## 2017-04-26 DIAGNOSIS — R1031 Right lower quadrant pain: Secondary | ICD-10-CM | POA: Insufficient documentation

## 2017-04-26 LAB — COMPREHENSIVE METABOLIC PANEL
ALBUMIN: 4 g/dL (ref 3.5–5.0)
ALT: 48 U/L (ref 14–54)
AST: 23 U/L (ref 15–41)
Alkaline Phosphatase: 50 U/L (ref 38–126)
Anion gap: 9 (ref 5–15)
BILIRUBIN TOTAL: 0.3 mg/dL (ref 0.3–1.2)
BUN: 7 mg/dL (ref 6–20)
CHLORIDE: 106 mmol/L (ref 101–111)
CO2: 22 mmol/L (ref 22–32)
CREATININE: 0.58 mg/dL (ref 0.44–1.00)
Calcium: 9.1 mg/dL (ref 8.9–10.3)
GFR calc Af Amer: >60 mL/min (ref >60)
GLUCOSE: 96 mg/dL (ref 65–99)
Potassium: 3.6 mmol/L (ref 3.5–5.1)
Sodium: 137 mmol/L (ref 135–145)
TOTAL PROTEIN: 6.8 g/dL (ref 6.5–8.1)

## 2017-04-26 LAB — URINALYSIS, ROUTINE W REFLEX MICROSCOPIC
Bilirubin Urine: NEGATIVE
GLUCOSE, UA: NEGATIVE mg/dL
Hgb urine dipstick: NEGATIVE
KETONES UR: NEGATIVE mg/dL
LEUKOCYTES UA: NEGATIVE
Nitrite: NEGATIVE
PH: 7 (ref 5.0–8.0)
PROTEIN: NEGATIVE mg/dL
Specific Gravity, Urine: 1.015 (ref 1.005–1.030)

## 2017-04-26 LAB — LIPASE, BLOOD: Lipase: 32 U/L (ref 11–51)

## 2017-04-26 LAB — CBC
HCT: 33 % — ABNORMAL LOW (ref 36.0–46.0)
Hemoglobin: 10.9 g/dL — ABNORMAL LOW (ref 12.0–15.0)
MCH: 28.2 pg (ref 26.0–34.0)
MCHC: 33 g/dL (ref 30.0–36.0)
MCV: 85.5 fL (ref 78.0–100.0)
PLATELETS: 313 10*3/uL (ref 150–400)
RBC: 3.86 MIL/uL — ABNORMAL LOW (ref 3.87–5.11)
RDW: 14.3 % (ref 11.5–15.5)
WBC: 4.6 10*3/uL (ref 4.0–10.5)

## 2017-04-26 LAB — I-STAT BETA HCG BLOOD, ED (MC, WL, AP ONLY): I-stat hCG, quantitative: <5 m[IU]/mL (ref <5)

## 2017-04-26 MED ORDER — ONDANSETRON HCL 4 MG/2ML IJ SOLN
4.0000 mg | Freq: Once | INTRAMUSCULAR | Status: AC
  Administered 2017-04-26: 4 mg via INTRAVENOUS
  Filled 2017-04-26: qty 2

## 2017-04-26 MED ORDER — IOPAMIDOL (ISOVUE-300) INJECTION 61%
INTRAVENOUS | Status: AC
  Administered 2017-04-26: 100 mL via INTRAVENOUS
  Filled 2017-04-26: qty 100

## 2017-04-26 MED ORDER — KETOROLAC TROMETHAMINE 15 MG/ML IJ SOLN
15.0000 mg | Freq: Once | INTRAMUSCULAR | Status: AC
  Administered 2017-04-26: 15 mg via INTRAVENOUS
  Filled 2017-04-26: qty 1

## 2017-04-26 MED ORDER — ONDANSETRON HCL 4 MG PO TABS: 4.0000 mg | ORAL_TABLET | Freq: Three times a day (TID) | ORAL | 0 refills | Status: DC | PRN

## 2017-04-26 NOTE — ED Provider Notes (Signed)
[facility] EMERGENCY DEPARTMENT Provider Note   CSN: [id number] Arrival date & time: 04/26/17  1244     History   Chief Complaint Chief Complaint  Patient presents with  . Abdominal Pain    HPI Stacey Moyer is a 22 y.o. female presenting for evaluation of abd pain.   Pt states she has had intermittent R sided abd pain for the past 1 yr. It began intermittent, but has become more constant with episodes of sharp pain, lasting about an hr. The pain is worsened with movement, improved with lying still. No association with PO intake, BMs, or urination. Pain is sharp and nonradiating. She has not tired any medication for it.  She has associated nausea, vomited once today.  She denies fevers, chills, chest pain, shortness of breath, left-sided abdominal pain, urinary symptoms, abnormal bowel movements.  She denies history of similar prior to a year ago.  She has never been evaluated by a GI doctor.  Patient has an IUD, placed several months after her abdominal pain started.  Takes Wellbutrin, has been doing so for several years.   HPI  Past Medical History:  Diagnosis Date  . Anxiety   . Asthma   . Depression   . Seasonal allergies     Patient Active Problem List   Diagnosis Date Noted  . Yeast infection 04/27/2015  . Eczema 01/28/2015  . Weight loss 05/03/2014  . Postinflammatory hypopigmentation 02/13/2014  . Dysmenorrhea 02/13/2014  . Patellar subluxation 01/15/2014  . Presence of subdermal contraceptive implant 02/15/2013  . Allergic rhinitis 09/19/2012  . Mild persistent asthma 09/19/2012    Past Surgical History:  Procedure Laterality Date  . TOOTH EXTRACTION      OB History    Gravida Para Term Preterm AB Living   0 0 0 0 0 0   SAB TAB Ectopic Multiple Live Births   0 0 0 0 0       Home Medications    Prior to Admission medications   Medication Sig Start Date End Date Taking? Authorizing Provider  albuterol (PROVENTIL HFA;VENTOLIN HFA)  108 (90 Base) MCG/ACT inhaler Inhale 2 puffs into the lungs every 4 (four) hours as needed. For shortness of breath 02/19/16   Melony Overly, MD  azithromycin (ZITHROMAX Z-PAK) 250 MG tablet Take 2 pills today, then 1 pill daily until gone. 02/19/16   Melony Overly, MD  beclomethasone (QVAR) 80 MCG/ACT inhaler Inhale 2 puffs into the lungs 2 (two) times daily. 01/28/15   Janell Quiet, MD  benzonatate (TESSALON) 100 MG capsule Take 1 capsule (100 mg total) by mouth every 8 (eight) hours. 02/19/16   Melony Overly, MD  buPROPion (WELLBUTRIN XL) 300 MG 24 hr tablet TAKE 1 TABLET BY MOUTH EVERY MORNING. 02/21/16   Arfeen, Arlyce Harman, MD  cetirizine (ZYRTEC) 10 MG tablet Take 1 tablet (10 mg total) by mouth daily. 06/07/15   Ok Edwards, MD  etonogestrel (NEXPLANON) 68 MG IMPL implant 1 each by Subdermal route once.    [provider]  fluconazole (DIFLUCAN) 150 MG tablet fluconazole 150 mg every 72 hours for 3 doses (DAY 1, 4 & 7, followed by maintenance fluconazole 150 mg once per week for 6 months. 07/25/15   Simha, Jerrel Ivory, MD  fluticasone (FLONASE) 50 MCG/ACT nasal spray Place 2 sprays into both nostrils daily. 01/28/15   Janell Quiet, MD  ibuprofen (ADVIL,MOTRIN) 600 MG tablet Take 1 tablet (600 mg total) by mouth every  6 (six) hours as needed. Patient not taking: Reported on 01/28/2015 06/27/14   Melony Overly, MD  montelukast (SINGULAIR) 10 MG tablet Take 10 mg by mouth at bedtime. Reported on 07/25/2015    [provider]  ondansetron (ZOFRAN) 4 MG tablet Take 1 tablet (4 mg total) by mouth every 8 (eight) hours as needed for nausea or vomiting. 04/26/17   Meredith Kilbride, PA-C  predniSONE (DELTASONE) 50 MG tablet Take 1 pill daily for 5 days. 02/19/16   Melony Overly, MD  triamcinolone ointment (KENALOG) 0.1 % Apply 1 application topically 2 (two) times daily. Patient not taking: Reported on 07/25/2015 01/28/15   Janell Quiet, MD    Family History Family  History  Problem Relation Age of Onset  . Asthma Brother   . Depression Brother   . ADD / ADHD Brother   . Depression Father   . Thyroid disease Mother   . Cancer Maternal Uncle     Social History Social History   Tobacco Use  . Smoking status: Never Smoker  . Smokeless tobacco: Never Used  Substance Use Topics  . Alcohol use: No    Alcohol/week: 0.0 oz  . Drug use: No     Allergies   Penicillins   Review of Systems Review of Systems  Constitutional: Negative for chills and fever.  Eyes: Negative for visual disturbance.  Respiratory: Negative for chest tightness and shortness of breath.   Cardiovascular: Negative for chest pain.  Gastrointestinal: Positive for abdominal pain, nausea and vomiting (one episode today). Negative for abdominal distention, blood in stool, constipation and diarrhea.  Genitourinary: Negative for dysuria, flank pain, frequency and hematuria.  Musculoskeletal: Negative for back pain.  Skin: Negative for rash.  Allergic/Immunologic: Negative for immunocompromised state.  Neurological: Negative for dizziness and headaches.  Hematological: Does not bruise/bleed easily.  Psychiatric/Behavioral: Negative for confusion.     Physical Exam Updated Vital Signs BP 99/63 (BP Location: Left Arm)   Pulse 62   Temp 98.9 F (37.2 C) (Oral)   Resp 14   Ht 5\' 3"  (1.6 m)   Wt 65.3 kg (144 lb)   SpO2 100%   BMI 25.51 kg/m   Physical Exam  Constitutional: She is oriented to person, place, and time. She appears well-developed and well-nourished. No distress.  HENT:  Head: Normocephalic and atraumatic.  Eyes: Conjunctivae and EOM are normal. Pupils are equal, round, and reactive to light.  Neck: Normal range of motion. Neck supple.  Cardiovascular: Normal rate, regular rhythm and intact distal pulses.  Pulmonary/Chest: Effort normal and breath sounds normal. No respiratory distress. She has no wheezes.  Abdominal: Soft. Bowel sounds are normal. She  exhibits no distension and no mass. There is tenderness. There is no guarding.  Mild tenderness to palpation of right-sided abdomen.  No tenderness to palpation of the left side.  Abdomen is soft, without rigidity, guarding, or distention.  Bowel sounds normoactive x4.  Musculoskeletal: Normal range of motion.  Neurological: She is alert and oriented to person, place, and time.  Skin: Skin is warm and dry.  Psychiatric: She has a normal mood and affect.  Nursing note and vitals reviewed.    ED Treatments / Results  Labs (all labs ordered are listed, but only abnormal results are displayed) Labs Reviewed  CBC - Abnormal; Notable for the following components:      Result Value   RBC 3.86 (*)    Hemoglobin 10.9 (*)    HCT 33.0 (*)  All other components within normal limits  URINALYSIS, ROUTINE W REFLEX MICROSCOPIC - Abnormal; Notable for the following components:   Color, Urine STRAW (*)    All other components within normal limits  LIPASE, BLOOD  COMPREHENSIVE METABOLIC PANEL  I-STAT BETA HCG BLOOD, ED ([facility], [facility], [facility] ONLY)    EKG  EKG Interpretation None       Radiology Ct Abdomen Pelvis W Contrast  Result Date: 04/26/2017 CLINICAL DATA:  Right-sided abdominal pain for 1 year, with worsening in past 2 weeks. Nausea and vomiting. EXAM: CT ABDOMEN AND PELVIS WITH CONTRAST TECHNIQUE: Multidetector CT imaging of the abdomen and pelvis was performed using the standard protocol following bolus administration of intravenous contrast. CONTRAST:  139mL ISOVUE-300 IOPAMIDOL (ISOVUE-300) INJECTION 61% COMPARISON:  02/25/2014 FINDINGS: Lower Chest: No acute findings. Hepatobiliary: No hepatic masses identified. Gallbladder is unremarkable. Pancreas:  No mass or inflammatory changes. Spleen: Within normal limits in size and appearance. Adrenals/Urinary Tract: No masses identified. No evidence of hydronephrosis. Stomach/Bowel: No evidence of obstruction, inflammatory process or abnormal fluid  collections. Normal appendix visualized. Vascular/Lymphatic: No pathologically enlarged lymph nodes. No abdominal aortic aneurysm. Reproductive: IUD seen in appropriate position in the uterus. Adnexal regions are unremarkable. Other:  None. Musculoskeletal:  No suspicious bone lesions identified. IMPRESSION: No acute findings or other significant abnormality. Electronically Signed   By: Earle Gell M.D.   On: 04/26/2017 15:45    Procedures Procedures (including critical care time)  Medications Ordered in ED Medications  ondansetron (ZOFRAN) injection 4 mg (4 mg Intravenous Given 04/26/17 1445)  ketorolac (TORADOL) 15 MG/ML injection 15 mg (15 mg Intravenous Given 04/26/17 1445)  iopamidol (ISOVUE-300) 61 % injection (100 mLs Intravenous Contrast Given 04/26/17 1527)     Initial Impression / Assessment and Plan / ED Course  I have reviewed the triage vital signs and the nursing notes.  Pertinent labs & imaging results that were available during my care of the patient were reviewed by me and considered in my medical decision making (see chart for details).     Patient presenting for evaluation of right-sided abdominal pain.  This is been worsening over the past year, today with associated nausea and vomiting.  Physical exam shows mild tenderness palpation of the right sided abdomen without rigidity or distention.  Will obtain basic labs, urine, and CT for further evaluation.  Zofran and ketorolac given for sx control.   Patient reports nausea resolved and pain has improved with medications.  Labs reassuring, no leukocytosis.  Electrolytes stable.  No abnormality in kidney, liver, pancreas function.  Urine negative for infection.  CT without abnormality or signs of infection, perforation, obstruction.  Discussed findings with patient.  Discussed symptomatic treatment with Tylenol, ibuprofen, or Zofran.  Follow-up with GI as needed.  At this time, patient appears safe for discharge.  Return  precautions given.  Patient states she understands and agrees to plan.   Final Clinical Impressions(s) / ED Diagnoses   Final diagnoses:  Right sided abdominal pain    ED Discharge Orders        Ordered    ondansetron (ZOFRAN) 4 MG tablet  Every 8 hours PRN     04/26/17 1635       Rakesha Dalporto, PA-C 04/26/17 1647    Jola Schmidt, MD 04/26/17 1700

## 2017-04-26 NOTE — ED Triage Notes (Signed)
Pt c/o RLQ pain, states shes had it off and on for several years. Pt has tenderness to RLQ x2 days.

## 2017-04-26 NOTE — ED Triage Notes (Signed)
Per Pt, Pt is coming from [facility] with complaints of Right sided sharp abdominal pain that has been intermittent in the last year. Pt was sent down here to be evaluated. Denies urinary symptoms.

## 2017-04-26 NOTE — Discharge Instructions (Signed)
Use Zofran as needed for nausea or vomiting. Use Tylenol or ibuprofen as needed for pain. Follow-up with the GI doctor for further evaluation of your abdominal pain. Return to the emergency room if you develop high fevers, persistent vomiting, or any new or worsening symptoms.

## 2017-04-26 NOTE — ED Notes (Signed)
Spoke with patient about imaging capabilities and with Lanelle Bal NP, pt decided to go to the ER instead.

## 2017-04-26 NOTE — ED Notes (Signed)
Pt stated that care was good and everyone was nice and the treated her pain.

## 2017-05-04 ENCOUNTER — Other Ambulatory Visit: Payer: Self-pay

## 2017-05-17 MED FILL — BUPROPION HCL XL 150 MG TAB: 150 | 30 days supply | Qty: 90 | Fill #0

## 2017-05-20 ENCOUNTER — Other Ambulatory Visit: Payer: Self-pay

## 2017-05-20 ENCOUNTER — Other Ambulatory Visit: Payer: Self-pay | Admitting: Obstetrics and Gynecology

## 2017-05-20 DIAGNOSIS — D242 Benign neoplasm of left breast: Secondary | ICD-10-CM

## 2017-05-24 ENCOUNTER — Ambulatory Visit
Admission: RE | Admit: 2017-05-24 | Discharge: 2017-05-24 | Disposition: A | Discharge status: Home / Self Care | Payer: No Typology Code available for payment source | Source: Ambulatory Visit | Attending: Obstetrics and Gynecology | Admitting: Obstetrics and Gynecology

## 2017-05-24 ENCOUNTER — Other Ambulatory Visit: Payer: Self-pay | Admitting: Obstetrics and Gynecology

## 2017-05-24 DIAGNOSIS — D242 Benign neoplasm of left breast: Secondary | ICD-10-CM

## 2017-06-30 ENCOUNTER — Encounter: Payer: Self-pay | Admitting: Gastroenterology

## 2017-08-16 ENCOUNTER — Ambulatory Visit: Payer: Self-pay | Admitting: Gastroenterology

## 2017-10-15 ENCOUNTER — Ambulatory Visit: Payer: Self-pay | Admitting: Gastroenterology

## 2017-11-26 ENCOUNTER — Inpatient Hospital Stay: Admission: RE | Admit: 2017-11-26 | Payer: Self-pay | Source: Ambulatory Visit

## 2017-12-20 ENCOUNTER — Inpatient Hospital Stay: Admission: RE | Admit: 2017-12-20 | Payer: Self-pay | Source: Ambulatory Visit

## 2018-05-16 ENCOUNTER — Ambulatory Visit: Payer: Self-pay

## 2018-05-16 ENCOUNTER — Other Ambulatory Visit: Payer: Self-pay | Admitting: Family Medicine

## 2018-05-16 DIAGNOSIS — S20211A Contusion of right front wall of thorax, initial encounter: Secondary | ICD-10-CM

## 2018-05-16 DIAGNOSIS — M25562 Pain in left knee: Secondary | ICD-10-CM

## 2018-05-16 DIAGNOSIS — M25511 Pain in right shoulder: Secondary | ICD-10-CM

## 2018-10-09 ENCOUNTER — Emergency Department (HOSPITAL_COMMUNITY): Payer: BLUE CROSS/BLUE SHIELD

## 2018-10-09 ENCOUNTER — Emergency Department (HOSPITAL_COMMUNITY)
Admission: EM | Admit: 2018-10-09 | Discharge: 2018-10-09 | Disposition: A | Discharge status: Home / Self Care | Payer: BLUE CROSS/BLUE SHIELD | Attending: Emergency Medicine | Admitting: Emergency Medicine

## 2018-10-09 ENCOUNTER — Other Ambulatory Visit: Payer: Self-pay

## 2018-10-09 ENCOUNTER — Encounter (HOSPITAL_COMMUNITY): Payer: Self-pay | Admitting: *Deleted

## 2018-10-09 DIAGNOSIS — R0789 Other chest pain: Secondary | ICD-10-CM | POA: Insufficient documentation

## 2018-10-09 DIAGNOSIS — R072 Precordial pain: Secondary | ICD-10-CM | POA: Diagnosis not present

## 2018-10-09 DIAGNOSIS — R42 Dizziness and giddiness: Secondary | ICD-10-CM | POA: Insufficient documentation

## 2018-10-09 DIAGNOSIS — R079 Chest pain, unspecified: Secondary | ICD-10-CM | POA: Diagnosis present

## 2018-10-09 DIAGNOSIS — J45909 Unspecified asthma, uncomplicated: Secondary | ICD-10-CM | POA: Insufficient documentation

## 2018-10-09 LAB — BASIC METABOLIC PANEL
Anion gap: 7 (ref 5–15)
BUN: 5 mg/dL — ABNORMAL LOW (ref 6–20)
CO2: 23 mmol/L (ref 22–32)
Calcium: 9.7 mg/dL (ref 8.9–10.3)
Chloride: 108 mmol/L (ref 98–111)
Creatinine, Ser: 0.73 mg/dL (ref 0.44–1.00)
GFR calc Af Amer: >60 mL/min (ref >60)
GFR calc non Af Amer: >60 mL/min (ref >60)
Glucose, Bld: 119 mg/dL — ABNORMAL HIGH (ref 70–99)
Potassium: 4 mmol/L (ref 3.5–5.1)
Sodium: 138 mmol/L (ref 135–145)

## 2018-10-09 LAB — CBC
HCT: 40.4 % (ref 36.0–46.0)
Hemoglobin: 13.1 g/dL (ref 12.0–15.0)
MCH: 27.1 pg (ref 26.0–34.0)
MCHC: 32.4 g/dL (ref 30.0–36.0)
MCV: 83.6 fL (ref 80.0–100.0)
Platelets: 288 10*3/uL (ref 150–400)
RBC: 4.83 MIL/uL (ref 3.87–5.11)
RDW: 14.9 % (ref 11.5–15.5)
WBC: 4.7 10*3/uL (ref 4.0–10.5)
nRBC: 0 % (ref 0.0–0.2)

## 2018-10-09 LAB — POC URINE PREG, ED: Preg Test, Ur: NEGATIVE

## 2018-10-09 MED ORDER — ALUM & MAG HYDROXIDE-SIMETH 200-200-20 MG/5ML PO SUSP
15.0000 mL | Freq: Once | ORAL | Status: AC
  Administered 2018-10-09: 15:00:00 15 mL via ORAL
  Filled 2018-10-09: qty 30

## 2018-10-09 MED ORDER — FAMOTIDINE 20 MG PO TABS
20.0000 mg | ORAL_TABLET | Freq: Once | ORAL | Status: AC
  Administered 2018-10-09: 20 mg via ORAL
  Filled 2018-10-09: qty 1

## 2018-10-09 MED ORDER — ONDANSETRON 4 MG PO TBDP: 4.0000 mg | ORAL_TABLET | Freq: Three times a day (TID) | ORAL | 0 refills | Status: DC | PRN

## 2018-10-09 MED ORDER — ACETAMINOPHEN 500 MG PO TABS
1000.0000 mg | ORAL_TABLET | Freq: Once | ORAL | Status: AC
  Administered 2018-10-09: 15:00:00 1000 mg via ORAL
  Filled 2018-10-09: qty 2

## 2018-10-09 NOTE — ED Triage Notes (Signed)
Pt states acute onset L sided chest burning, sob and dizziness that increases with activity.

## 2018-10-09 NOTE — ED Provider Notes (Signed)
Stacey Moyer Provider Note   CSN: [id number] Arrival date & time: 10/09/18  1406     History   Chief Complaint Chief Complaint  Patient presents with  . Chest Pain  . Dizziness    HPI Stacey Moyer is a 23 y.o. female.     Patient c/o mid chest pain for the past day. Pain acute onset, dull to sharp, worse w palpation chest wall, non radiating, occurs at rest, not related to exertion. Not pleuritic. No change whether upright or supine. Denies cough, sore throat, or uri symptoms. No fever or chills. Denies abd pain or nv. No sob. No leg pain or swelling. No recent surgery, immobility, trauma, or travel. No hx dvt or pe. Non smoker. Hx asthma, but states stable for years. No other exertional cp or unusual doe.    The history is provided by the patient.  Chest Pain Associated symptoms: no abdominal pain, no back pain, no cough, no fever, no headache, no palpitations, no shortness of breath and no vomiting   Dizziness Associated symptoms: chest pain   Associated symptoms: no headaches, no palpitations, no shortness of breath and no vomiting     Past Medical History:  Diagnosis Date  . Anxiety   . Asthma   . Depression   . Seasonal allergies     Patient Active Problem List   Diagnosis Date Noted  . Yeast infection 04/27/2015  . Eczema 01/28/2015  . Weight loss 05/03/2014  . Postinflammatory hypopigmentation 02/13/2014  . Dysmenorrhea 02/13/2014  . Patellar subluxation 01/15/2014  . Presence of subdermal contraceptive implant 02/15/2013  . Allergic rhinitis 09/19/2012  . Mild persistent asthma 09/19/2012    Past Surgical History:  Procedure Laterality Date  . TOOTH EXTRACTION       OB History    Gravida  0   Para  0   Term  0   Preterm  0   AB  0   Living  0     SAB  0   TAB  0   Ectopic  0   Multiple  0   Live Births  0            Home Medications    Prior to Admission medications   Medication Sig  Start Date End Date Taking? Authorizing Provider  albuterol (PROVENTIL HFA;VENTOLIN HFA) 108 (90 Base) MCG/ACT inhaler Inhale 2 puffs into the lungs every 4 (four) hours as needed. For shortness of breath 02/19/16   Melony Overly, MD  azithromycin (ZITHROMAX Z-PAK) 250 MG tablet Take 2 pills today, then 1 pill daily until gone. 02/19/16   Melony Overly, MD  beclomethasone (QVAR) 80 MCG/ACT inhaler Inhale 2 puffs into the lungs 2 (two) times daily. 01/28/15   Janell Quiet, MD  benzonatate (TESSALON) 100 MG capsule Take 1 capsule (100 mg total) by mouth every 8 (eight) hours. 02/19/16   Melony Overly, MD  buPROPion (WELLBUTRIN XL) 300 MG 24 hr tablet TAKE 1 TABLET BY MOUTH EVERY MORNING. 02/21/16   Arfeen, Arlyce Harman, MD  cetirizine (ZYRTEC) 10 MG tablet Take 1 tablet (10 mg total) by mouth daily. 06/07/15   Ok Edwards, MD  etonogestrel (NEXPLANON) 68 MG IMPL implant 1 each by Subdermal route once.    [provider]  fluconazole (DIFLUCAN) 150 MG tablet fluconazole 150 mg every 72 hours for 3 doses (DAY 1, 4 & 7, followed by maintenance fluconazole 150 mg once per week for  6 months. 07/25/15   Ok Edwards, MD  fluticasone (FLONASE) 50 MCG/ACT nasal spray Place 2 sprays into both nostrils daily. 01/28/15   Janell Quiet, MD  ibuprofen (ADVIL,MOTRIN) 600 MG tablet Take 1 tablet (600 mg total) by mouth every 6 (six) hours as needed. Patient not taking: Reported on 01/28/2015 06/27/14   Melony Overly, MD  montelukast (SINGULAIR) 10 MG tablet Take 10 mg by mouth at bedtime. Reported on 07/25/2015    [provider]  ondansetron (ZOFRAN) 4 MG tablet Take 1 tablet (4 mg total) by mouth every 8 (eight) hours as needed for nausea or vomiting. 04/26/17   Caccavale, Sophia, PA-C  predniSONE (DELTASONE) 50 MG tablet Take 1 pill daily for 5 days. 02/19/16   Melony Overly, MD  triamcinolone ointment (KENALOG) 0.1 % Apply 1 application topically 2 (two) times daily. Patient not taking:  Reported on 07/25/2015 01/28/15   Janell Quiet, MD    Family History Family History  Problem Relation Age of Onset  . Asthma Brother   . Depression Brother   . ADD / ADHD Brother   . Depression Father   . Thyroid disease Mother   . Cancer Maternal Uncle     Social History Social History   Tobacco Use  . Smoking status: Never Smoker  . Smokeless tobacco: Never Used  Substance Use Topics  . Alcohol use: No    Alcohol/week: 0.0 standard drinks  . Drug use: No     Allergies   Penicillins   Review of Systems Review of Systems  Constitutional: Negative for fever.  HENT: Negative for sore throat.   Eyes: Negative for redness.  Respiratory: Negative for cough and shortness of breath.   Cardiovascular: Positive for chest pain. Negative for palpitations and leg swelling.  Gastrointestinal: Negative for abdominal pain and vomiting.  Genitourinary: Negative for flank pain.  Musculoskeletal: Negative for back pain and neck pain.  Skin: Negative for rash.  Neurological: Negative for headaches.  Hematological: Does not bruise/bleed easily.  Psychiatric/Behavioral: Negative for confusion.     Physical Exam Updated Vital Signs BP 128/87 (BP Location: Right Arm)   Pulse 91   Temp 99.3 F (37.4 C) (Oral)   Resp 18   Ht 1.6 m (5\' 3" )   Wt 64.4 kg   LMP 10/08/2018   SpO2 97%   BMI 25.15 kg/m   Physical Exam Vitals signs and nursing note reviewed.  Constitutional:      Appearance: Normal appearance. She is well-developed.  HENT:     Head: Atraumatic.     Nose: Nose normal.     Mouth/Throat:     Mouth: Mucous membranes are moist.  Eyes:     General: No scleral icterus.    Conjunctiva/sclera: Conjunctivae normal.  Neck:     Musculoskeletal: Normal range of motion and neck supple. No neck rigidity or muscular tenderness.     Trachea: No tracheal deviation.  Cardiovascular:     Rate and Rhythm: Normal rate and regular rhythm.     Pulses: Normal pulses.      Heart sounds: Normal heart sounds. No murmur. No friction rub. No gallop.   Pulmonary:     Effort: Pulmonary effort is normal. No respiratory distress.     Breath sounds: Normal breath sounds.     Comments: +mid to left chest wall tenderness, reproducing symptoms. No crepitus. No sts.  Chest:     Chest wall: Tenderness present.  Abdominal:  General: Bowel sounds are normal. There is no distension.     Palpations: Abdomen is soft.     Tenderness: There is no abdominal tenderness. There is no guarding.  Genitourinary:    Comments: No cva tenderness.  Musculoskeletal:        General: No swelling or tenderness.     Right lower leg: No edema.     Left lower leg: No edema.  Skin:    General: Skin is warm and dry.     Findings: No rash.  Neurological:     Mental Status: She is alert.     Comments: Alert, speech normal. Steady gait.   Psychiatric:        Mood and Affect: Mood normal.      ED Treatments / Results  Labs (all labs ordered are listed, but only abnormal results are displayed) Results for orders placed or performed during the hospital encounter of 10/09/18  CBC  Result Value Ref Range   WBC 4.7 4.0 - 10.5 K/uL   RBC 4.83 3.87 - 5.11 MIL/uL   Hemoglobin 13.1 12.0 - 15.0 g/dL   HCT 40.4 36.0 - 46.0 %   MCV 83.6 80.0 - 100.0 fL   MCH 27.1 26.0 - 34.0 pg   MCHC 32.4 30.0 - 36.0 g/dL   RDW 14.9 11.5 - 15.5 %   Platelets 288 150 - 400 K/uL   nRBC 0.0 0.0 - 0.2 %  Basic metabolic panel  Result Value Ref Range   Sodium 138 135 - 145 mmol/L   Potassium 4.0 3.5 - 5.1 mmol/L   Chloride 108 98 - 111 mmol/L   CO2 23 22 - 32 mmol/L   Glucose, Bld 119 (H) 70 - 99 mg/dL   BUN 5 (L) 6 - 20 mg/dL   Creatinine, Ser 0.73 0.44 - 1.00 mg/dL   Calcium 9.7 8.9 - 10.3 mg/dL   GFR calc non Af Amer >60 >60 mL/min   GFR calc Af Amer >60 >60 mL/min   Anion gap 7 5 - 15  POC Urine Pregnancy, ED (not at [facility])  Result Value Ref Range   Preg Test, Ur NEGATIVE NEGATIVE   Dg Chest  Port 1 View  Result Date: 10/09/2018 CLINICAL DATA:  CP, SOB EXAM: PORTABLE CHEST 1 VIEW COMPARISON:  05/16/2018 FINDINGS: Shallow lung inflation. Heart size is normal. Lungs are clear. IMPRESSION: No active disease. Electronically Signed   By: Nolon Nations M.D.   On: 10/09/2018 15:04    EKG EKG Interpretation  Date/Time:  Sunday October 09 2018 14:27:59 EDT Ventricular Rate:  91 PR Interval:    QRS Duration: 80 QT Interval:  376 QTC Calculation: 463 R Axis:   91 Text Interpretation:  Sinus rhythm Borderline right axis deviation Baseline wander in lead(s) V1 Confirmed by Lajean Saver [id number]) on 10/09/2018 2:29:57 PM   Radiology Dg Chest Port 1 View  Result Date: 10/09/2018 CLINICAL DATA:  CP, SOB EXAM: PORTABLE CHEST 1 VIEW COMPARISON:  05/16/2018 FINDINGS: Shallow lung inflation. Heart size is normal. Lungs are clear. IMPRESSION: No active disease. Electronically Signed   By: Nolon Nations M.D.   On: 10/09/2018 15:04    Procedures Procedures (including critical care time)  Medications Ordered in ED Medications  famotidine (PEPCID) tablet 20 mg (has no administration in time range)  acetaminophen (TYLENOL) tablet 1,000 mg (has no administration in time range)  alum & mag hydroxide-simeth (MAALOX/MYLANTA) 200-200-20 MG/5ML suspension 15 mL (has no administration in time range)  Initial Impression / Assessment and Plan / ED Course  I have reviewed the triage vital signs and the nursing notes.  Pertinent labs & imaging results that were available during my care of the patient were reviewed by me and considered in my medical decision making (see chart for details).  Ecg. Cxr. Labs.   Reviewed nursing notes and prior charts for additional history.  Prior CT abd imaging noted to be neg acute, ct/us previous - no gallstones.   Acetaminophen po, pepcid po - for symptom relief.  CXR reviewed by me - no pna.   Labs reviewed by me - hgb/wbc is normal.  Symptoms and exam  felt most c/w musculoskeletal chest pain. Information provided. Rec ibuprofen or aleve prn, and pcp f/u.  Return precautions provided.     Final Clinical Impressions(s) / ED Diagnoses   Final diagnoses:  None    ED Discharge Orders    None       Lajean Saver, MD 10/09/18 1527

## 2018-10-09 NOTE — Discharge Instructions (Addendum)
It was our pleasure to provide your ER care today - we hope that you feel better.  Take ibuprofen or aleve as need.   Follow up with primary care doctor in 1 week if symptoms fail to improve/resolve.  Return to ER if worse, new symptoms, fevers, increased trouble breathing, recurrent/persistent chest pain, other concern.

## 2019-03-24 ENCOUNTER — Ambulatory Visit (HOSPITAL_COMMUNITY): Payer: BLUE CROSS/BLUE SHIELD

## 2019-03-24 ENCOUNTER — Ambulatory Visit (HOSPITAL_COMMUNITY)
Admission: EM | Admit: 2019-03-24 | Discharge: 2019-03-24 | Disposition: A | Discharge status: Home / Self Care | Payer: PRIVATE HEALTH INSURANCE | Attending: Family Medicine | Admitting: Family Medicine

## 2019-03-24 ENCOUNTER — Ambulatory Visit (INDEPENDENT_AMBULATORY_CARE_PROVIDER_SITE_OTHER): Payer: PRIVATE HEALTH INSURANCE

## 2019-03-24 ENCOUNTER — Encounter (HOSPITAL_COMMUNITY): Payer: Self-pay

## 2019-03-24 DIAGNOSIS — W228XXA Striking against or struck by other objects, initial encounter: Secondary | ICD-10-CM

## 2019-03-24 DIAGNOSIS — M25572 Pain in left ankle and joints of left foot: Secondary | ICD-10-CM | POA: Diagnosis not present

## 2019-03-24 DIAGNOSIS — Y990 Civilian activity done for income or pay: Secondary | ICD-10-CM

## 2019-03-24 DIAGNOSIS — R0781 Pleurodynia: Secondary | ICD-10-CM

## 2019-03-24 DIAGNOSIS — M25512 Pain in left shoulder: Secondary | ICD-10-CM

## 2019-03-24 DIAGNOSIS — Z042 Encounter for examination and observation following work accident: Secondary | ICD-10-CM | POA: Diagnosis not present

## 2019-03-24 DIAGNOSIS — M25562 Pain in left knee: Secondary | ICD-10-CM | POA: Diagnosis not present

## 2019-03-24 DIAGNOSIS — W19XXXA Unspecified fall, initial encounter: Secondary | ICD-10-CM

## 2019-03-24 MED ORDER — TIZANIDINE HCL 4 MG PO TABS: 4.0000 mg | ORAL_TABLET | Freq: Four times a day (QID) | ORAL | 0 refills | Status: DC | PRN

## 2019-03-24 MED ORDER — IBUPROFEN 800 MG PO TABS: 800.0000 mg | ORAL_TABLET | Freq: Three times a day (TID) | ORAL | 0 refills | Status: DC

## 2019-03-24 NOTE — Discharge Instructions (Signed)
Home today to rest. Take ibuprofen 3 times a day with food.  This is for pain and inflammation. Take tizanidine if needed as muscle relaxer.  This is useful at bedtime.  It may cause drowsiness.  Do not take this at work May return to work if they have light duty for you.  May work in a sedentary capacity Follow-up next week with Worker's Comp. clinic
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
## 2019-03-28 ENCOUNTER — Other Ambulatory Visit: Payer: Self-pay

## 2019-03-28 ENCOUNTER — Ambulatory Visit: Payer: Worker's Compensation

## 2019-03-28 ENCOUNTER — Other Ambulatory Visit: Payer: Self-pay | Admitting: Family Medicine

## 2019-03-28 DIAGNOSIS — M79645 Pain in left finger(s): Secondary | ICD-10-CM

## 2021-11-12 ENCOUNTER — Encounter (HOSPITAL_COMMUNITY): Payer: Self-pay | Admitting: Emergency Medicine

## 2021-11-12 ENCOUNTER — Ambulatory Visit (HOSPITAL_COMMUNITY)
Admission: EM | Admit: 2021-11-12 | Discharge: 2021-11-12 | Disposition: A | Discharge status: Home / Self Care | Payer: PRIVATE HEALTH INSURANCE | Attending: Family Medicine | Admitting: Family Medicine

## 2021-11-12 DIAGNOSIS — S63501A Unspecified sprain of right wrist, initial encounter: Secondary | ICD-10-CM | POA: Diagnosis not present

## 2021-11-12 NOTE — Discharge Instructions (Signed)
If not allergic, you may use over the counter ibuprofen or acetaminophen as needed. Wear your wrist splint until follow up with occupational health.

## 2021-11-12 NOTE — ED Triage Notes (Signed)
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 Reports right wrist was under patient when moving patient and got bent backwards. Was put in wrist brace at work.

## 2021-11-13 NOTE — ED Provider Notes (Signed)
[facility]   [id number] 11/12/21 Arrival Time: 4034  ASSESSMENT & PLAN:  1. Sprain of right wrist, initial encounter    No indication for plain imaging at this time.  Will continue with wrist brace. Ice to wrist. Has ibuprofen at home to take '800mg'$  TID with food for the next few days.    Discharge Instructions      If not allergic, you may use over the counter ibuprofen or acetaminophen as needed. Wear your wrist splint until follow up with occupational health.    Work/school excuse note: provided. With recommended restrictions.   Follow-up Information     Call  [facility].   Why: [address] [place], [place] [address]  [phone]                Reviewed expectations re: course of current medical issues. Questions answered. Outlined signs and symptoms indicating need for more acute intervention. Patient verbalized understanding. After Visit Summary given.  SUBJECTIVE: History from: patient. Sent here for worker's comp initial evaluation.  Talar Fraley is a 26 y.o. female who reports RIGHT wrist pain. Reports injury today. Noted pain after assisting non-responsive pt to stretcher from wheelchair. Reports hand/wrist caught between pt and bed; hyperextended with resulting pain. No extremity sensation changes or weakness.  Took ibuprofen; helped. In wrist brace applied in ED.  Past Surgical History:  Procedure Laterality Date   TOOTH EXTRACTION        OBJECTIVE:  Vitals:   11/12/21 1744  BP: 110/70  Pulse: 90  Resp: 16  Temp: 98.3 F (36.8 C)  TempSrc: Oral  SpO2: 98%    General appearance: alert; no distress HEENT: [place]; AT Neck: supple with FROM Resp: unlabored respirations Extremities: RUE: warm with well perfused appearance; poorly localized moderate tenderness over right wrist; without gross deformities; swelling: minimal; bruising: none; wrist ROM: normal, with discomfort CV: brisk extremity  capillary refill of RUE; 2+ radial pulse of RUE. Skin: warm and dry; no visible rashes Neurologic: gait normal; normal sensation and strength of RUE Psychological: alert and cooperative; normal mood and affect  Allergies  Allergen Reactions   Penicillins Rash    unknown    Past Medical History:  Diagnosis Date   Anxiety    Asthma    Depression    Seasonal allergies    Social History   Socioeconomic History   Marital status: Married    Spouse name: Not on file   Number of children: Not on file   Years of education: Not on file   Highest education level: Not on file  Occupational History   Not on file  Tobacco Use   Smoking status: Never   Smokeless tobacco: Never  Substance and Sexual Activity   Alcohol use: No    Alcohol/week: 0.0 standard drinks of alcohol   Drug use: No   Sexual activity: Yes    Partners: Male    Birth control/protection: I.U.D.  Other Topics Concern   Not on file  Social History Narrative   Not on file   Social Determinants of Health   Financial Resource Strain: Not on file  Food Insecurity: Not on file  Transportation Needs: Not on file  Physical Activity: Not on file  Stress: Not on file  Social Connections: Not on file   Family History  Problem Relation Age of Onset   Asthma Brother    Depression Brother    ADD / ADHD Brother    Depression Father  Thyroid disease Mother    Cancer Maternal Uncle    Past Surgical History:  Procedure Laterality Date   TOOTH EXTRACTION         Vanessa Kick, MD 11/13/21 [phone]

## 2022-05-19 ENCOUNTER — Encounter (HOSPITAL_BASED_OUTPATIENT_CLINIC_OR_DEPARTMENT_OTHER): Payer: Self-pay

## 2022-05-19 ENCOUNTER — Emergency Department (HOSPITAL_BASED_OUTPATIENT_CLINIC_OR_DEPARTMENT_OTHER)
Admission: EM | Admit: 2022-05-19 | Discharge: 2022-05-19 | Disposition: A | Discharge status: Home / Self Care | Payer: PRIVATE HEALTH INSURANCE | Attending: Emergency Medicine | Admitting: Emergency Medicine

## 2022-05-19 ENCOUNTER — Other Ambulatory Visit (HOSPITAL_BASED_OUTPATIENT_CLINIC_OR_DEPARTMENT_OTHER): Payer: Self-pay

## 2022-05-19 DIAGNOSIS — J029 Acute pharyngitis, unspecified: Secondary | ICD-10-CM | POA: Diagnosis present

## 2022-05-19 DIAGNOSIS — J45909 Unspecified asthma, uncomplicated: Secondary | ICD-10-CM | POA: Diagnosis not present

## 2022-05-19 DIAGNOSIS — R231 Pallor: Secondary | ICD-10-CM | POA: Diagnosis not present

## 2022-05-19 DIAGNOSIS — R079 Chest pain, unspecified: Secondary | ICD-10-CM | POA: Diagnosis not present

## 2022-05-19 DIAGNOSIS — J020 Streptococcal pharyngitis: Secondary | ICD-10-CM

## 2022-05-19 DIAGNOSIS — R42 Dizziness and giddiness: Secondary | ICD-10-CM | POA: Diagnosis not present

## 2022-05-19 DIAGNOSIS — R0902 Hypoxemia: Secondary | ICD-10-CM | POA: Diagnosis not present

## 2022-05-19 DIAGNOSIS — R55 Syncope and collapse: Secondary | ICD-10-CM

## 2022-05-19 DIAGNOSIS — R0789 Other chest pain: Secondary | ICD-10-CM | POA: Diagnosis not present

## 2022-05-19 MED ORDER — ONDANSETRON 4 MG PO TBDP
4.0000 mg | ORAL_TABLET | Freq: Three times a day (TID) | ORAL | 0 refills | Status: DC | PRN
  Filled 2022-05-19: qty 20, 7d supply, fill #0

## 2022-05-19 MED ORDER — CEFDINIR 250 MG/5ML PO SUSR
600.0000 mg | Freq: Every day | ORAL | 0 refills | Status: AC
  Filled 2022-05-19: qty 120, 10d supply, fill #0

## 2022-05-19 NOTE — ED Triage Notes (Signed)
Pt via [facility] after being seen at [facility]. +strep at UC, given decadron injection. States immediately after injection, experienced SHOB, nausea, "room spinning," diaphoresis.   C/o L ear redness, R ear pain.   18LAC, '4mg'$  zofran, 500cc bolus

## 2022-05-19 NOTE — ED Notes (Signed)
Pt states she feels much better than earlier

## 2022-05-19 NOTE — ED Notes (Signed)
Ambulated 150 feet, felt a little lightheaded per pt,but a lot better than earlier. Vss upon return to room.

## 2022-05-19 NOTE — Discharge Instructions (Addendum)
Change your toothbrush in 2 days and you can use Cepacol lozenge or spray to help with some of the throat pain.  You can also try Tylenol and ibuprofen as well.  The antibiotic he received is liquid but you only have to take it once a day for 10 days.  You are also given a prescription for some nausea medication.  I would expect you should be feeling better in the next 48 hours.  If you start feeling worse, difficulty breathing inability to swallow return to the emergency room.

## 2022-05-19 NOTE — ED Provider Notes (Signed)
[facility] Provider Note   CSN: [id number] Arrival date & time: 05/19/22  1402     History {Add pertinent medical, surgical, social history, OB history to HPI:1} No chief complaint on file.   Stacey Moyer is a 27 y.o. female.  Patient is a 27 year old female with a history of asthma, depression and allergies who is presenting today from urgent care after a near syncopal event.  Patient reports that over the last few days she initially started with nasal congestion and mild sore throat but today her sore throat was significantly worse with trouble swallowing.  She went to urgent care and at that time was diagnosed with strep throat.  They were giving her a shot of Decadron and giving her a prescription for antibiotics.  She reports immediately after getting the shot of Decadron she was standing up to leave when she suddenly started feeling lightheaded, short of breath, tingling in her arms and legs and she went very pale.  They laid her down but she still continued to have tingling in her arms and legs.  She denied any stridor, rash, itchiness.  She reports in the past when she has had an allergic reaction to penicillin she had a rash and itching but nothing like that today.  She has not been eating well today because of her throat hurting and reported her blood sugar was around 100.  She was given fluid and Zofran and route by EMS and reports she feels much better now.  She feels like she can swallow now denies any shortness of breath.  LMP was within the last month.  The history is provided by the patient.       Home Medications Prior to Admission medications   Medication Sig Start Date End Date Taking? Authorizing Provider  albuterol (PROVENTIL HFA;VENTOLIN HFA) 108 (90 Base) MCG/ACT inhaler Inhale 2 puffs into the lungs every 4 (four) hours as needed. For shortness of breath 02/19/16   Melony Overly, MD  beclomethasone (QVAR) 80 MCG/ACT inhaler  Inhale 2 puffs into the lungs 2 (two) times daily. 01/28/15   Janell Quiet, MD  buPROPion (WELLBUTRIN XL) 300 MG 24 hr tablet TAKE 1 TABLET BY MOUTH EVERY MORNING. 02/21/16   Arfeen, Arlyce Harman, MD  cephALEXin (KEFLEX) 500 MG capsule Take 500 mg by mouth 2 (two) times daily. 05/19/22   [provider]  etonogestrel (NEXPLANON) 68 MG IMPL implant 1 each by Subdermal route once.    [provider]  FLUoxetine (PROZAC) 20 MG capsule Take 20 mg by mouth daily. 04/23/22   [provider]  gabapentin (NEURONTIN) 300 MG capsule Take by mouth. 04/23/22   [provider]  ibuprofen (ADVIL) 800 MG tablet Take 1 tablet (800 mg total) by mouth 3 (three) times daily. 03/24/19   Raylene Everts, MD  lisdexamfetamine (VYVANSE) 50 MG capsule Take 50 mg by mouth daily. 05/16/22   [provider]  montelukast (SINGULAIR) 10 MG tablet Take 10 mg by mouth at bedtime. Reported on 07/25/2015    [provider]  tiZANidine (ZANAFLEX) 4 MG tablet Take 1-2 tablets (4-8 mg total) by mouth every 6 (six) hours as needed for muscle spasms. 03/24/19   Raylene Everts, MD  cetirizine (ZYRTEC) 10 MG tablet Take 1 tablet (10 mg total) by mouth daily. 06/07/15 03/24/19  Ok Edwards, MD      Allergies    Penicillins    Review of Systems  Review of Systems  Physical Exam Updated Vital Signs BP 112/66 (BP Location: Right Arm)   Pulse 87   Temp 99.1 F (37.3 C) (Oral)   Resp 14   SpO2 100%  Physical Exam Vitals and nursing note reviewed.  Constitutional:      General: She is not in acute distress.    Appearance: She is well-developed.  HENT:     Head: Normocephalic and atraumatic.     Mouth/Throat:     Pharynx: Posterior oropharyngeal erythema present.     Comments: No uvular edema or tongue swelling.  No stridor Eyes:     Pupils: Pupils are equal, round, and reactive to light.  Cardiovascular:     Rate and Rhythm: Normal rate and regular rhythm.     Heart  sounds: Normal heart sounds. No murmur heard.    No friction rub.  Pulmonary:     Effort: Pulmonary effort is normal.     Breath sounds: Normal breath sounds. No wheezing or rales.  Abdominal:     General: Bowel sounds are normal. There is no distension.     Palpations: Abdomen is soft.     Tenderness: There is no abdominal tenderness. There is no guarding or rebound.  Musculoskeletal:        General: No tenderness. Normal range of motion.     Comments: No edema  Skin:    General: Skin is warm and dry.     Findings: No rash.  Neurological:     Mental Status: She is alert and oriented to person, place, and time.     Cranial Nerves: No cranial nerve deficit.  Psychiatric:        Behavior: Behavior normal.     ED Results / Procedures / Treatments   Labs (all labs ordered are listed, but only abnormal results are displayed) Labs Reviewed - No data to display  EKG None  Radiology No results found.  Procedures Procedures  {Document cardiac monitor, telemetry assessment procedure when appropriate:1}  Medications Ordered in ED Medications - No data to display  ED Course/ Medical Decision Making/ A&P   {   Click here for ABCD2, HEART and other calculatorsREFRESH Note before signing :1}                          Medical Decision Making  Pt presenting today with a complaint that caries a high risk for morbidity and mortality.  Based on patient's description appears that she had a near syncopal event today while she was in the urgent care clinic after getting an IM injection of Decadron.  Patient reports she went to stand up and immediately started feeling short of breath, dizzy, pale and numb and tingly all over her body.  She was not given any specific medications other than Zofran and IV fluids reports feeling better now.  She does not have a rash, stridor or any other findings concerning for an allergic reaction or anaphylaxis.  Suspect vasovagal event with near syncope.  I  independently interpreted patient's EKG which is normal without evidence for prolonged QT, Brugada's or WPW.  Patient was given water here.  She is able to tolerate p.o.'s.  Vital signs are reassuring.  She was able to walk without symptoms.  Feel that she is stable for discharge home.  She was given a prescription for an antibiotic to take for the strep throat which she will pick up from urgent care.   {Document  critical care time when appropriate:1} {Document review of labs and clinical decision tools ie heart score, Chads2Vasc2 etc:1}  {Document your independent review of radiology images, and any outside records:1} {Document your discussion with family members, caretakers, and with consultants:1} {Document social determinants of health affecting pt's care:1} {Document your decision making why or why not admission, treatments were needed:1} Final Clinical Impression(s) / ED Diagnoses Final diagnoses:  None    Rx / DC Orders ED Discharge Orders     None

## 2022-05-21 ENCOUNTER — Telehealth: Payer: Self-pay | Admitting: *Deleted

## 2022-05-21 NOTE — Transitions of Care (Post Inpatient/ED Visit) (Signed)
   05/21/2022  Name: Stacey Moyer MRN: [id number] DOB: [birth year]  Today's TOC FU Call Status: Today's TOC FU Call Status:: Unsuccessul Call (1st Attempt) Unsuccessful Call (1st Attempt) Date: 05/21/22  Attempted to reach the patient regarding the most recent Inpatient/ED visit.  Follow Up Plan: Additional outreach attempts will be made to reach the patient to complete the Transitions of Care (Post Inpatient/ED visit) call.   Lurena Joiner RN, BSN [place]  [employer] manager

## 2022-08-06 DIAGNOSIS — J208 Acute bronchitis due to other specified organisms: Secondary | ICD-10-CM | POA: Diagnosis not present

## 2024-02-01 ENCOUNTER — Other Ambulatory Visit: Payer: Self-pay

## 2024-02-01 ENCOUNTER — Encounter: Payer: Self-pay | Admitting: Allergy

## 2024-02-01 ENCOUNTER — Ambulatory Visit (INDEPENDENT_AMBULATORY_CARE_PROVIDER_SITE_OTHER): Admitting: Allergy

## 2024-02-01 VITALS — BP 116/68 | HR 82 | Temp 98.4°F | Resp 16 | Ht 63.0 in | Wt 154.4 lb

## 2024-02-01 DIAGNOSIS — J4520 Mild intermittent asthma, uncomplicated: Secondary | ICD-10-CM | POA: Diagnosis not present

## 2024-02-01 DIAGNOSIS — L309 Dermatitis, unspecified: Secondary | ICD-10-CM | POA: Diagnosis not present

## 2024-02-01 DIAGNOSIS — L2089 Other atopic dermatitis: Secondary | ICD-10-CM

## 2024-02-01 DIAGNOSIS — J3089 Other allergic rhinitis: Secondary | ICD-10-CM

## 2024-02-01 DIAGNOSIS — L299 Pruritus, unspecified: Secondary | ICD-10-CM | POA: Diagnosis not present

## 2024-02-01 DIAGNOSIS — Z880 Allergy status to penicillin: Secondary | ICD-10-CM

## 2024-02-01 MED ORDER — TRIAMCINOLONE ACETONIDE 0.1 % EX OINT: 1.0000 | TOPICAL_OINTMENT | Freq: Two times a day (BID) | CUTANEOUS | 2 refills | Status: AC | PRN

## 2024-02-01 NOTE — Patient Instructions (Addendum)
 Rash Keep track of rashes and take pictures. Write down what you had done/eaten during flares.  See below for proper skin care. Use fragrance free and dye free products. No dryer sheets or fabric softener.   Use triamcinolone  0.1% ointment twice a day as needed for rash flares. Do not use on the face, neck, armpits or groin area. Do not use more than 3 weeks in a row.  Get bloodwork We are ordering labs, so please allow 1-2 weeks for the results to come back. With the newly implemented Cures Act, the labs might be visible to you at the same time that they become visible to me. However, I will not address the results until all of the results are back, so please be patient.  In the meantime, continue recommendations in your patient instructions, including avoidance measures (if applicable), until you hear from me.   Environmental allergies Use over the counter antihistamines such as Zyrtec  (cetirizine ), Claritin  (loratadine ), Allegra  (fexofenadine ), or Xyzal (levocetirizine) daily as needed. May take twice a day during allergy flares. May switch antihistamines every few months. Get bloodwork Will make additional recommendations based on results.   Breathing Normal breathing test today. May use albuterol  rescue inhaler 2 puffs every 4 to 6 hours as needed for shortness of breath, chest tightness, coughing, and wheezing. May use albuterol  rescue inhaler 2 puffs 5 to 15 minutes prior to strenuous physical activities. Monitor frequency of use - if you need to use it more than twice per week on a consistent basis let us  know.   Penicillin allergy: Consider penicillin allergy skin testing and in office drug challenge in the future.  Over 90% of people with history of penicillin allergy which occurred over 10 years ago are found to be non-allergic.  You must be off antihistamines for 3-5 days before. Must be in good health and not ill. No vaccines/injections/antibiotics within the past 7 days. Plan  on being in the office for 2-3 hours and must bring in the drug you want to do the oral challenge for - will send in prescription to pick up a few days before. You must call to schedule an appointment and specify it's for a drug challenge.   Follow up in 2 months or sooner if needed.   Skin care recommendations  Bath time: Always use lukewarm water. AVOID very hot or cold water. Keep bathing time to 5-10 minutes. Do NOT use bubble bath. Use a mild soap and use just enough to wash the dirty areas. Do NOT scrub skin vigorously.  After bathing, pat dry your skin with a towel. Do NOT rub or scrub the skin.  Moisturizers and prescriptions:  ALWAYS apply moisturizers immediately after bathing (within 3 minutes). This helps to lock-in moisture. Use the moisturizer several times a day over the whole body. Good summer moisturizers include: Aveeno, CeraVe, Cetaphil. Good winter moisturizers include: Aquaphor, Vaseline, Cerave, Cetaphil, Eucerin, Vanicream. When using moisturizers along with medications, the moisturizer should be applied about one hour after applying the medication to prevent diluting effect of the medication or moisturize around where you applied the medications. When not using medications, the moisturizer can be continued twice daily as maintenance.  Laundry and clothing: Avoid laundry products with added color or perfumes. Use unscented hypo-allergenic laundry products such as Tide free, Cheer free & gentle, and All free and clear.  If the skin still seems dry or sensitive, you can try double-rinsing the clothes. Avoid tight or scratchy clothing such as wool. Do  not use fabric softeners or dyer sheets.

## 2024-02-01 NOTE — Progress Notes (Signed)
 New Patient Note  RE: Stacey Moyer MRN: [id number] DOB: 1995-11-12 Date of Office Visit: 02/01/2024  Consult requested by: Adine Duwaine MATSU, FNP Primary care provider: Adine Duwaine MATSU, FNP  Chief Complaint: Rash (Rash for several years. Uses free and clear products and zyrtec  2 tablets nightly. Skin tested at age 28 allergic to all environmental she did do allergy shots once a week for a year.)  History of Present Illness: I had the pleasure of seeing Stacey Moyer for initial evaluation at the [facility] on 02/01/2024. She is a 28 y.o. female, who is referred here by Bradley, Megan G, FNP for the evaluation of rash.  Discussed the use of AI scribe software for clinical note transcription with the patient, who gave verbal consent to proceed.    She has a long-standing history of allergies since childhood and underwent skin testing when she was younger. Recently, her skin has become more irritated, with red patches appearing on her legs and back. These issues have persisted for a couple of years, primarily affecting her arms, back, and lower legs. The patches are red, dry, and itchy, with increased sensitivity to heat and dry conditions. She experiences more dry patches in the winter and clusters of bumps in the heat.  She has been using Zyrtec , taking two tablets once a day at night, to manage her symptoms, although it only helps slightly. She also uses a hypoallergenic lotion and avoids scented products. She has not used topical steroid creams in recent years due to lack of insurance and has not seen a dermatologist before. She was previously seen by an allergist for rashes and asthma.  Her asthma, which was more problematic in the spring, has not required recent use of an inhaler due to decreased activity following an injury. She has an albuterol  inhaler, which she last used during exertion at work. She also takes Flonase  primarily in the spring for sinus issues.  She has a  history of penicillin allergy, causing hives, and her father shares this allergy. No food allergies and has not been stung by a bee. She reports a history of eczema, as she was told previously.  She is currently taking Wellbutrin , Prozac, gabapentin , and Vyvanse.      Rash started about a few years ago. Mainly occurs on her arms, back, lower legs. Describes them as red, itchy, dry patch. Associated symptoms include: none.  Frequency of flares: worse in the winter. Suspected triggers are unknown but heat makes it worse. She has tried the following therapies: zyrtec  with some benefit. Systemic steroids: no. Currently on zyrtec  daily.  Hypoallergenic currell, fragrance free laundry detergent.  Previous work up includes: as a child saw allergy for environmental allergies and asthma.  Assessment and Plan: Stacey Moyer is a 28 y.o. female with:    Chronic pruritic dermatitis with atopic features  Chronic dermatitis with atopic features, exacerbated by environmental factors. Limited relief with Zyrtec . No recent topical steroid use or dermatology consultation. Keep track of rashes and take pictures. Write down what you had done/eaten during flares.  See below for proper skin care. Use fragrance free and dye free products. No dryer sheets or fabric softener.   Use triamcinolone  0.1% ointment twice a day as needed for rash flares. Do not use on the face, neck, armpits or groin area. Do not use more than 3 weeks in a row.  Get bloodwork.  Allergic rhinitis Sinus symptoms worsen in spring. Partial relief with Zyrtec  and Flonase .  Allergy shots discontinued due to insurance issues as a child.  Use over the counter antihistamines such as Zyrtec  (cetirizine ), Claritin  (loratadine ), Allegra  (fexofenadine ), or Xyzal (levocetirizine) daily as needed. May take twice a day during allergy flares. May switch antihistamines every few months. Get bloodwork as patient unable to stop antihistamines for 3 days. Will make  additional recommendations based on results.   Mild intermittent asthma without complication Triggered by exertion and infections. No recent pulmonary function testing. Today's spirometry was normal. May use albuterol  rescue inhaler 2 puffs every 4 to 6 hours as needed for shortness of breath, chest tightness, coughing, and wheezing. May use albuterol  rescue inhaler 2 puffs 5 to 15 minutes prior to strenuous physical activities. Monitor frequency of use - if you need to use it more than twice per week on a consistent basis let us  know.   Penicillin allergy Hives as a teen. Consider penicillin allergy skin testing and in office drug challenge in the future.  Over 90% of people with history of penicillin allergy which occurred over 10 years ago are found to be non-allergic.   Return in about 2 months (around 04/02/2024).  Meds ordered this encounter  Medications   triamcinolone  ointment (KENALOG ) 0.1 %    Sig: Apply 1 Application topically 2 (two) times daily as needed (rash flare). Do not use on the face, neck, armpits or groin area. Do not use more than 3 weeks in a row.    Dispense:  30 g    Refill:  2   Lab Orders         Alpha-Gal Panel         Tryptase         Chronic Urticaria (CU) Eval         Allergens w/Total IgE Area 2         Comprehensive metabolic panel with GFR         ANA, IFA (with reflex)      Other allergy screening: Asthma: yes Mainly exertion and infection induced and uses albuterol  prn with good benefit.  Rhino conjunctivitis: yes Currently takes zyrtec  and Flonase  prn. Spring is the worst time of the year. Patient was on allergy injections for 1 year with some benefit.  Food allergy: no  Medication allergy: yes Penicillin - hives as a teen. Hymenoptera allergy: no Eczema:yes History of recurrent infections suggestive of immunodeficency: no  Diagnostics: Spirometry:  Tracings reviewed. Her effort: Good reproducible efforts. FVC: 3.46L FEV1: 2.94L,  101% predicted FEV1/FVC ratio: 85% Interpretation: Spirometry consistent with normal pattern.  Please see scanned spirometry results for details.  Results discussed with patient/family.   Past Medical History: Patient Active Problem List   Diagnosis Date Noted   Yeast infection 04/27/2015   Eczema 01/28/2015   Weight loss 05/03/2014   Postinflammatory hypopigmentation 02/13/2014   Dysmenorrhea 02/13/2014   Patellar subluxation 01/15/2014   Presence of subdermal contraceptive implant 02/15/2013   Allergic rhinitis 09/19/2012   Mild persistent asthma 09/19/2012   Past Medical History:  Diagnosis Date   Anxiety    Asthma    Depression    Seasonal allergies    Past Surgical History: Past Surgical History:  Procedure Laterality Date   TOOTH EXTRACTION     Medication List:  Current Outpatient Medications  Medication Sig Dispense Refill   triamcinolone  ointment (KENALOG ) 0.1 % Apply 1 Application topically 2 (two) times daily as needed (rash flare). Do not use on the face, neck, armpits or groin area. Do not  use more than 3 weeks in a row. 30 g 2   albuterol  (PROVENTIL  HFA;VENTOLIN  HFA) 108 (90 Base) MCG/ACT inhaler Inhale 2 puffs into the lungs every 4 (four) hours as needed. For shortness of breath (Patient not taking: Reported on 02/01/2024) 1 Inhaler 1   buPROPion  (WELLBUTRIN  XL) 300 MG 24 hr tablet TAKE 1 TABLET BY MOUTH EVERY MORNING. 30 tablet 0   FLUoxetine (PROZAC) 20 MG capsule Take 20 mg by mouth daily.     gabapentin  (NEURONTIN ) 300 MG capsule Take by mouth.     lisdexamfetamine (VYVANSE) 50 MG capsule Take 50 mg by mouth daily.     No current facility-administered medications for this visit.   Allergies: Allergies  Allergen Reactions   Penicillins Rash    unknown   Social History: Social History   Socioeconomic History   Marital status: Married    Spouse name: Not on file   Number of children: Not on file   Years of education: Not on file   Highest  education level: Not on file  Occupational History   Not on file  Tobacco Use   Smoking status: Never    Passive exposure: Never   Smokeless tobacco: Never  Vaping Use   Vaping status: Never Used  Substance and Sexual Activity   Alcohol use: No    Alcohol/week: 0.0 standard drinks of alcohol   Drug use: No   Sexual activity: Yes    Partners: Male    Birth control/protection: I.U.D.  Other Topics Concern   Not on file  Social History Narrative   02/01/2024 outside dog and cat.    Social Drivers of Corporate Investment Banker Strain: Low Risk  (12/30/2023)   Received from [facility]   Overall Financial Resource Strain (CARDIA)    How hard is it for you to pay for the very basics like food, housing, medical care, and heating?: Not hard at all  Food Insecurity: No Food Insecurity (12/30/2023)   Received from [facility]   Hunger Vital Sign    Within the past 12 months, you worried that your food would run out before you got the money to buy more.: Never true    Within the past 12 months, the food you bought just didn't last and you didn't have money to get more.: Never true  Transportation Needs: No Transportation Needs (12/30/2023)   Received from [facility] - Transportation    In the past 12 months, has lack of transportation kept you from medical appointments or from getting medications?: No    In the past 12 months, has lack of transportation kept you from meetings, work, or from getting things needed for daily living?: No  Physical Activity: Inactive (12/30/2023)   Received from [facility]   Exercise Vital Sign    On average, how many days per week do you engage in moderate to strenuous exercise (like a brisk walk)?: 1 day    On average, how many minutes do you engage in exercise at this level?: 0 min  Stress: No Stress Concern Present (12/30/2023)   Received from [facility] - Occupational Stress  Questionnaire    Do you feel stress - tense, restless, nervous, or anxious, or unable to sleep at night because your mind is troubled all the time - these days?: Only a little  Social Connections: Moderately Integrated (12/30/2023)   Received from [facility]   Social Network  How would you rate your social network (family, work, friends)?: Adequate participation with social networks   Lives in a house. Smoking: denies Occupation: used to work in the ER.  Environmental History: Water Damage/mildew in the house: yes Carpet in the family room: no Carpet in the bedroom: no Heating: gas Cooling: central Pet: no  Family History: Family History  Problem Relation Age of Onset   Asthma Brother    Depression Brother    ADD / ADHD Brother    Depression Father    Thyroid disease Mother    Cancer Maternal Uncle    Review of Systems  Constitutional:  Negative for appetite change, chills, fever and unexpected weight change.  HENT:  Negative for congestion and rhinorrhea.   Eyes:  Negative for itching.  Respiratory:  Negative for cough, chest tightness, shortness of breath and wheezing.   Cardiovascular:  Negative for chest pain.  Gastrointestinal:  Negative for abdominal pain.  Genitourinary:  Negative for difficulty urinating.  Skin:  Positive for rash.  Neurological:  Negative for headaches.    Objective: BP 116/68   Pulse 82   Temp 98.4 F (36.9 C) (Temporal)   Resp 16   Ht 5' 3 (1.6 m)   Wt 154 lb 6.4 oz (70 kg)   SpO2 99%   BMI 27.35 kg/m  Body mass index is 27.35 kg/m. Physical Exam Vitals and nursing note reviewed.  Constitutional:      Appearance: Normal appearance. She is well-developed.  HENT:     Head: Normocephalic and atraumatic.     Right Ear: Tympanic membrane and external ear normal.     Left Ear: Tympanic membrane and external ear normal.     Nose: Nose normal.     Mouth/Throat:     Mouth: Mucous membranes are moist.     Pharynx: Oropharynx is  clear.  Eyes:     Conjunctiva/sclera: Conjunctivae normal.  Cardiovascular:     Rate and Rhythm: Normal rate and regular rhythm.     Heart sounds: Normal heart sounds. No murmur heard.    No friction rub. No gallop.  Pulmonary:     Effort: Pulmonary effort is normal.     Breath sounds: Normal breath sounds. No wheezing, rhonchi or rales.  Musculoskeletal:     Cervical back: Neck supple.  Skin:    General: Skin is warm.     Findings: No rash.  Neurological:     Mental Status: She is alert and oriented to person, place, and time.  Psychiatric:        Behavior: Behavior normal.    The plan was reviewed with the patient/family, and all questions/concerned were addressed.  It was my pleasure to see Stacey Moyer today and participate in her care. Please feel free to contact me with any questions or concerns.  Sincerely,  Orlan Cramp, DO Allergy & Immunology  [facility]  [place] office: [phone] [address] [facility] office: [phone]

## 2024-02-07 DIAGNOSIS — Z789 Other specified health status: Secondary | ICD-10-CM | POA: Diagnosis not present

## 2024-02-07 DIAGNOSIS — S6981XS Other specified injuries of right wrist, hand and finger(s), sequela: Secondary | ICD-10-CM | POA: Diagnosis not present

## 2024-02-07 DIAGNOSIS — M6281 Muscle weakness (generalized): Secondary | ICD-10-CM | POA: Diagnosis not present

## 2024-02-07 DIAGNOSIS — M25531 Pain in right wrist: Secondary | ICD-10-CM | POA: Diagnosis not present

## 2024-02-07 DIAGNOSIS — S6991XA Unspecified injury of right wrist, hand and finger(s), initial encounter: Secondary | ICD-10-CM | POA: Diagnosis not present

## 2024-02-09 ENCOUNTER — Ambulatory Visit: Payer: Self-pay | Admitting: Allergy

## 2024-02-09 LAB — CHRONIC URTICARIA (CU) EVAL
ALT: 10 IU/L (ref 0–32)
Basophils Absolute: 0 x10E3/uL (ref 0.0–0.2)
Basos: 0 %
CRP: <1 mg/L (ref 0–10)
EOS (ABSOLUTE): 0 x10E3/uL (ref 0.0–0.4)
Eos: 1 %
Hematocrit: 39.2 % (ref 34.0–46.6)
Hemoglobin: 12.8 g/dL (ref 11.1–15.9)
Immature Grans (Abs): 0 x10E3/uL (ref 0.0–0.1)
Immature Granulocytes: 0 %
Lymphocytes Absolute: 2 x10E3/uL (ref 0.7–3.1)
Lymphs: 44 %
MCH: 30 pg (ref 26.6–33.0)
MCHC: 32.7 g/dL (ref 31.5–35.7)
MCV: 92 fL (ref 79–97)
Monocytes Absolute: 0.3 x10E3/uL (ref 0.1–0.9)
Monocytes: 7 %
Neutrophils Absolute: 2.2 x10E3/uL (ref 1.4–7.0)
Neutrophils: 48 %
Platelets: 309 x10E3/uL (ref 150–450)
Pooled Donor- BAT CU: 1.8 % (ref 0.00–10.60)
RBC: 4.26 x10E6/uL (ref 3.77–5.28)
RDW: 13.1 % (ref 11.7–15.4)
Sed Rate: 2 mm/h (ref 0–32)
TSH: 1.87 u[IU]/mL (ref 0.450–4.500)
Thyroperoxidase Ab SerPl-aCnc: 15 [IU]/mL (ref 0–34)
WBC: 4.6 x10E3/uL (ref 3.4–10.8)

## 2024-02-09 LAB — ALPHA-GAL PANEL
Allergen Lamb IgE: <0.1 kU/L
Beef IgE: <0.1 kU/L
IgE (Immunoglobulin E), Serum: 64 [IU]/mL (ref 6–495)
O215-IgE Alpha-Gal: <0.1 kU/L
Pork IgE: <0.1 kU/L

## 2024-02-09 LAB — ALLERGENS W/TOTAL IGE AREA 2
Alternaria Alternata IgE: <0.1 kU/L
Aspergillus Fumigatus IgE: <0.1 kU/L
Bermuda Grass IgE: 5.23 kU/L — AB
Cat Dander IgE: <0.1 kU/L
Cedar, Mountain IgE: <0.1 kU/L
Cladosporium Herbarum IgE: <0.1 kU/L
Cockroach, German IgE: <0.1 kU/L
Common Silver Birch IgE: <0.1 kU/L
Cottonwood IgE: <0.1 kU/L
D Farinae IgE: <0.1 kU/L
D Pteronyssinus IgE: <0.1 kU/L
Dog Dander IgE: 0.74 kU/L — AB
Elm, American IgE: <0.1 kU/L
Johnson Grass IgE: 6.5 kU/L — AB
Maple/Box Elder IgE: <0.1 kU/L
Mouse Urine IgE: <0.1 kU/L
Oak, White IgE: <0.1 kU/L
Pecan, Hickory IgE: <0.1 kU/L
Penicillium Chrysogen IgE: <0.1 kU/L
Pigweed, Rough IgE: <0.1 kU/L
Ragweed, Short IgE: <0.1 kU/L
Sheep Sorrel IgE Qn: <0.1 kU/L
Timothy Grass IgE: 25.7 kU/L — AB
White Mulberry IgE: <0.1 kU/L

## 2024-02-09 LAB — COMPREHENSIVE METABOLIC PANEL WITH GFR
AST: 14 IU/L (ref 0–40)
Albumin: 4.6 g/dL (ref 4.0–5.0)
Alkaline Phosphatase: 51 IU/L (ref 41–116)
BUN/Creatinine Ratio: 10 (ref 9–23)
BUN: 7 mg/dL (ref 6–20)
Bilirubin Total: 0.3 mg/dL (ref 0.0–1.2)
CO2: 21 mmol/L (ref 20–29)
Calcium: 9.5 mg/dL (ref 8.7–10.2)
Chloride: 100 mmol/L (ref 96–106)
Creatinine, Ser: 0.7 mg/dL (ref 0.57–1.00)
Globulin, Total: 2.4 g/dL (ref 1.5–4.5)
Glucose: 82 mg/dL (ref 70–99)
Potassium: 4 mmol/L (ref 3.5–5.2)
Sodium: 138 mmol/L (ref 134–144)
Total Protein: 7 g/dL (ref 6.0–8.5)
eGFR: 120 mL/min/1.73 (ref >59)

## 2024-02-09 LAB — TRYPTASE: Tryptase: 3 ug/L (ref 2.2–13.2)

## 2024-02-09 LAB — ANTINUCLEAR ANTIBODIES, IFA: ANA Titer 1: POSITIVE — AB

## 2024-02-09 LAB — FANA STAINING PATTERNS: Speckled Pattern: 1:80 {titer}

## 2024-04-03 NOTE — Progress Notes (Unsigned)
 "  Follow Up Note  RE: Stacey Moyer MRN: [id number] DOB: 1996-02-18 Date of Office Visit: 04/04/2024  Referring provider: Adine Duwaine MATSU, FNP Primary care provider: Adine Duwaine MATSU, FNP  Chief Complaint: No chief complaint on file.  History of Present Illness: I had the pleasure of seeing Stacey Moyer for a follow up visit at the [facility] on 04/04/2024. She is a 29 y.o. female, who is being followed for chronic pruritic dermatitis, allergic rhinitis, asthma, penicillin allergy. Her previous allergy office visit was on 02/01/2024 with Dr. Luke. Today is a regular follow up visit.  Discussed the use of AI scribe software for clinical note transcription with the patient, who gave verbal consent to proceed.  History of Present Illness            2025 labs: Blood count, kidney function, liver function, electrolytes, thyroid, inflammation markers, chronic urticaria index (checks for autoantibodies that trigger mast cells), tryptase (checks for mast cell issues) and alpha gal (checks for red meat allergy) were all normal which is great.    Your autoimmune screener was borderline positive.  We can discuss at next visit if a rheumatology referral would be beneficial or not.   Environmental panel was positive to dog, grass. See below for environmental control measures.  Assessment and Plan: [name] is a 29 y.o. female with: Chronic pruritic dermatitis with atopic features  Chronic dermatitis with atopic features, exacerbated by environmental factors. Limited relief with Zyrtec . No recent topical steroid use or dermatology consultation. Keep track of rashes and take pictures. Write down what you had done/eaten during flares.  See below for proper skin care. Use fragrance free and dye free products. No dryer sheets or fabric softener.   Use triamcinolone  0.1% ointment twice a day as needed for rash flares. Do not use on the face, neck, armpits or groin area. Do not use  more than 3 weeks in a row.  Get bloodwork.   Allergic rhinitis Sinus symptoms worsen in spring. Partial relief with Zyrtec  and Flonase . Allergy shots discontinued due to insurance issues as a child.  Use over the counter antihistamines such as Zyrtec  (cetirizine ), Claritin  (loratadine ), Allegra  (fexofenadine ), or Xyzal (levocetirizine) daily as needed. May take twice a day during allergy flares. May switch antihistamines every few months. Get bloodwork as patient unable to stop antihistamines for 3 days. Will make additional recommendations based on results.    Mild intermittent asthma without complication Triggered by exertion and infections. No recent pulmonary function testing. Today's spirometry was normal. May use albuterol  rescue inhaler 2 puffs every 4 to 6 hours as needed for shortness of breath, chest tightness, coughing, and wheezing. May use albuterol  rescue inhaler 2 puffs 5 to 15 minutes prior to strenuous physical activities. Monitor frequency of use - if you need to use it more than twice per week on a consistent basis let us  know.   Penicillin allergy Hives as a teen. Consider penicillin allergy skin testing and in office drug challenge in the future.  Over 90% of people with history of penicillin allergy which occurred over 10 years ago are found to be non-allergic.  Assessment and Plan              No follow-ups on file.  No orders of the defined types were placed in this encounter.  Lab Orders  No laboratory test(s) ordered today    Diagnostics: Spirometry:  Tracings reviewed. Her effort: {Blank single:19197::Good reproducible efforts.,It was hard to get  consistent efforts and there is a question as to whether this reflects a maximal maneuver.,Poor effort, data can not be interpreted.} FVC: ***L FEV1: ***L, ***% predicted FEV1/FVC ratio: ***% Interpretation: {Blank single:19197::Spirometry consistent with mild obstructive disease,Spirometry  consistent with moderate obstructive disease,Spirometry consistent with severe obstructive disease,Spirometry consistent with possible restrictive disease,Spirometry consistent with mixed obstructive and restrictive disease,Spirometry uninterpretable due to technique,Spirometry consistent with normal pattern,No overt abnormalities noted given today's efforts}.  Please see scanned spirometry results for details.  Skin Testing: {Blank single:19197::Select foods,Environmental allergy panel,Environmental allergy panel and select foods,Food allergy panel,None,Deferred due to recent antihistamines use}. *** Results discussed with patient/family.   Medication List:  Current Outpatient Medications  Medication Sig Dispense Refill   albuterol  (PROVENTIL  HFA;VENTOLIN  HFA) 108 (90 Base) MCG/ACT inhaler Inhale 2 puffs into the lungs every 4 (four) hours as needed. For shortness of breath (Patient not taking: Reported on 02/01/2024) 1 Inhaler 1   buPROPion  (WELLBUTRIN  XL) 300 MG 24 hr tablet TAKE 1 TABLET BY MOUTH EVERY MORNING. 30 tablet 0   FLUoxetine (PROZAC) 20 MG capsule Take 20 mg by mouth daily.     gabapentin  (NEURONTIN ) 300 MG capsule Take by mouth.     lisdexamfetamine (VYVANSE) 50 MG capsule Take 50 mg by mouth daily.     triamcinolone  ointment (KENALOG ) 0.1 % Apply 1 Application topically 2 (two) times daily as needed (rash flare). Do not use on the face, neck, armpits or groin area. Do not use more than 3 weeks in a row. 30 g 2   No current facility-administered medications for this visit.   Allergies: Allergies[1] I reviewed her past medical history, social history, family history, and environmental history and no significant changes have been reported from her previous visit.  Review of Systems  Constitutional:  Negative for appetite change, chills, fever and unexpected weight change.  HENT:  Negative for congestion and rhinorrhea.   Eyes:  Negative for itching.   Respiratory:  Negative for cough, chest tightness, shortness of breath and wheezing.   Cardiovascular:  Negative for chest pain.  Gastrointestinal:  Negative for abdominal pain.  Genitourinary:  Negative for difficulty urinating.  Skin:  Positive for rash.  Neurological:  Negative for headaches.    Objective: There were no vitals taken for this visit. There is no height or weight on file to calculate BMI. Physical Exam Vitals and nursing note reviewed.  Constitutional:      Appearance: Normal appearance. She is well-developed.  HENT:     Head: Normocephalic and atraumatic.     Right Ear: Tympanic membrane and external ear normal.     Left Ear: Tympanic membrane and external ear normal.     Nose: Nose normal.     Mouth/Throat:     Mouth: Mucous membranes are moist.     Pharynx: Oropharynx is clear.  Eyes:     Conjunctiva/sclera: Conjunctivae normal.  Cardiovascular:     Rate and Rhythm: Normal rate and regular rhythm.     Heart sounds: Normal heart sounds. No murmur heard.    No friction rub. No gallop.  Pulmonary:     Effort: Pulmonary effort is normal.     Breath sounds: Normal breath sounds. No wheezing, rhonchi or rales.  Musculoskeletal:     Cervical back: Neck supple.  Skin:    General: Skin is warm.     Findings: No rash.  Neurological:     Mental Status: She is alert and oriented to person, place, and time.  Psychiatric:  Behavior: Behavior normal.    Previous notes and tests were reviewed. The plan was reviewed with the patient/family, and all questions/concerned were addressed.  It was my pleasure to see Jacqueli today and participate in her care. Please feel free to contact me with any questions or concerns.  Sincerely,  Orlan Cramp, DO Allergy & Immunology  [facility]  [facility] office: [phone] [facility] office: [phone]    [1]  Allergies Allergen Reactions   Penicillins Rash    unknown   "

## 2024-04-04 ENCOUNTER — Encounter: Payer: Self-pay | Admitting: Allergy

## 2024-04-04 ENCOUNTER — Ambulatory Visit: Admitting: Allergy

## 2024-04-04 ENCOUNTER — Other Ambulatory Visit: Payer: Self-pay

## 2024-04-04 VITALS — BP 122/70 | HR 82 | Temp 98.0°F | Resp 18 | Wt 151.8 lb

## 2024-04-04 DIAGNOSIS — J4520 Mild intermittent asthma, uncomplicated: Secondary | ICD-10-CM

## 2024-04-04 DIAGNOSIS — L299 Pruritus, unspecified: Secondary | ICD-10-CM

## 2024-04-04 DIAGNOSIS — Z880 Allergy status to penicillin: Secondary | ICD-10-CM | POA: Diagnosis not present

## 2024-04-04 DIAGNOSIS — J301 Allergic rhinitis due to pollen: Secondary | ICD-10-CM | POA: Diagnosis not present

## 2024-04-04 DIAGNOSIS — J3081 Allergic rhinitis due to animal (cat) (dog) hair and dander: Secondary | ICD-10-CM

## 2024-04-04 DIAGNOSIS — L2089 Other atopic dermatitis: Secondary | ICD-10-CM | POA: Diagnosis not present

## 2024-04-04 DIAGNOSIS — J3089 Other allergic rhinitis: Secondary | ICD-10-CM

## 2024-04-04 MED ORDER — BUDESONIDE-FORMOTEROL FUMARATE 80-4.5 MCG/ACT IN AERO: 2.0000 | INHALATION_SPRAY | Freq: Two times a day (BID) | RESPIRATORY_TRACT | 3 refills | Status: AC | PRN

## 2024-04-04 NOTE — Patient Instructions (Addendum)
 Rash Keep track of rashes and take pictures. Write down what you had done/eaten during flares.  Continue proper skin care. Use triamcinolone  0.1% ointment twice a day as needed for rash flares. Do not use on the face, neck, armpits or groin area. Do not use more than 3 weeks in a row.   Environmental allergies 2025 labs positive to dog and grass. See below for environmental control measures.  Use over the counter antihistamines such as Zyrtec  (cetirizine ), Claritin  (loratadine ), Allegra  (fexofenadine ), or Xyzal (levocetirizine) daily as needed. May take twice a day during allergy flares. May switch antihistamines every few months.  Breathing During respiratory infections/flares:  Start symbicort  80mcg 2 puffs twice a day and rinse mouth after each use for 1-2 weeks until your breathing symptoms return to baseline.  May use albuterol  rescue inhaler 2 puffs every 4 to 6 hours as needed for shortness of breath, chest tightness, coughing, and wheezing. May use albuterol  rescue inhaler 2 puffs 5 to 15 minutes prior to strenuous physical activities. Monitor frequency of use - if you need to use it more than twice per week on a consistent basis let us  know.  Breathing control goals:  Full participation in all desired activities (may need albuterol  before activity) Albuterol  use two times or less a week on average (not counting use with activity) Cough interfering with sleep two times or less a month Oral steroids no more than once a year No hospitalizations   Penicillin allergy: Consider penicillin allergy skin testing and in office drug challenge in the future.  Over 90% of people with history of penicillin allergy which occurred over 10 years ago are found to be non-allergic.  You must be off antihistamines for 3-5 days before. Must be in good health and not ill. No vaccines/injections/antibiotics within the past 7 days. Plan on being in the office for 2-3 hours and must bring in the drug you  want to do the oral challenge for - will send in prescription to pick up a few days before. You must call to schedule an appointment and specify it's for a drug challenge.   Follow up in 6 months or sooner if needed.   Reducing Pollen Exposure Pollen seasons: trees (spring), grass (summer) and ragweed/weeds (fall). Keep windows closed in your home and car to lower pollen exposure.  Install air conditioning in the bedroom and throughout the house if possible.  Avoid going out in dry windy days - especially early morning. Pollen counts are highest between 5 - 10 AM and on dry, hot and windy days.  Save outside activities for late afternoon or after a heavy rain, when pollen levels are lower.  Avoid mowing of grass if you have grass pollen allergy. Be aware that pollen can also be transported indoors on people and pets.  Dry your clothes in an automatic dryer rather than hanging them outside where they might collect pollen.  Rinse hair and eyes before bedtime.  Pet Allergen Avoidance: Contrary to popular opinion, there are no hypoallergenic breeds of dogs or cats. That is because people are not allergic to an animals hair, but to an allergen found in the animal's saliva, dander (dead skin flakes) or urine. Pet allergy symptoms typically occur within minutes. For some people, symptoms can build up and become most severe 8 to 12 hours after contact with the animal. People with severe allergies can experience reactions in public places if dander has been transported on the pet owners clothing. Keeping an animal outdoors  is only a partial solution, since homes with pets in the yard still have higher concentrations of animal allergens. Before getting a pet, ask your allergist to determine if you are allergic to animals. If your pet is already considered part of your family, try to minimize contact and keep the pet out of the bedroom and other rooms where you spend a great deal of time. As with dust  mites, vacuum carpets often or replace carpet with a hardwood floor, tile or linoleum. High-efficiency particulate air (HEPA) cleaners can reduce allergen levels over time. While dander and saliva are the source of cat and dog allergens, urine is the source of allergens from rabbits, hamsters, mice and guinea pigs; so ask a non-allergic family member to clean the animals cage. If you have a pet allergy, talk to your allergist about the potential for allergy immunotherapy (allergy shots). This strategy can often provide long-term relief.  Skin care recommendations  Bath time: Always use lukewarm water. AVOID very hot or cold water. Keep bathing time to 5-10 minutes. Do NOT use bubble bath. Use a mild soap and use just enough to wash the dirty areas. Do NOT scrub skin vigorously.  After bathing, pat dry your skin with a towel. Do NOT rub or scrub the skin.  Moisturizers and prescriptions:  ALWAYS apply moisturizers immediately after bathing (within 3 minutes). This helps to lock-in moisture. Use the moisturizer several times a day over the whole body. Good summer moisturizers include: Aveeno, CeraVe, Cetaphil. Good winter moisturizers include: Aquaphor, Vaseline, Cerave, Cetaphil, Eucerin, Vanicream. When using moisturizers along with medications, the moisturizer should be applied about one hour after applying the medication to prevent diluting effect of the medication or moisturize around where you applied the medications. When not using medications, the moisturizer can be continued twice daily as maintenance.  Laundry and clothing: Avoid laundry products with added color or perfumes. Use unscented hypo-allergenic laundry products such as Tide free, Cheer free & gentle, and All free and clear.  If the skin still seems dry or sensitive, you can try double-rinsing the clothes. Avoid tight or scratchy clothing such as wool. Do not use fabric softeners or dyer sheets.

## 2024-10-03 ENCOUNTER — Ambulatory Visit: Admitting: Allergy
